# Patient Record
Sex: Male | Born: 1970 | Race: Black or African American | Hispanic: No | Marital: Single | Smoking: Current every day smoker
Health system: Southern US, Community
[De-identification: ages and names within clinical notes are randomized; demographics above are authoritative.]

---

## 2003-07-12 ENCOUNTER — Emergency Department (HOSPITAL_COMMUNITY): Admission: EM | Admit: 2003-07-12 | Discharge: 2003-07-12 | Payer: Self-pay | Admitting: Emergency Medicine

## 2009-06-22 ENCOUNTER — Inpatient Hospital Stay (HOSPITAL_COMMUNITY): Admission: EM | Admit: 2009-06-22 | Discharge: 2009-06-24 | Payer: Self-pay | Admitting: Emergency Medicine

## 2009-06-23 ENCOUNTER — Encounter (INDEPENDENT_AMBULATORY_CARE_PROVIDER_SITE_OTHER): Payer: Self-pay | Admitting: Internal Medicine

## 2009-06-29 ENCOUNTER — Emergency Department (HOSPITAL_COMMUNITY): Admission: EM | Admit: 2009-06-29 | Discharge: 2009-06-29 | Payer: Self-pay | Admitting: Emergency Medicine

## 2009-08-12 ENCOUNTER — Emergency Department (HOSPITAL_COMMUNITY): Admission: EM | Admit: 2009-08-12 | Discharge: 2009-08-13 | Payer: Self-pay | Admitting: Emergency Medicine

## 2010-04-24 LAB — RAPID URINE DRUG SCREEN, HOSP PERFORMED
Amphetamines: NOT DETECTED
Barbiturates: NOT DETECTED
Barbiturates: NOT DETECTED
Benzodiazepines: NOT DETECTED
Benzodiazepines: POSITIVE — AB
Cocaine: NOT DETECTED
Opiates: NOT DETECTED
Tetrahydrocannabinol: POSITIVE — AB

## 2010-04-24 LAB — CARDIAC PANEL(CRET KIN+CKTOT+MB+TROPI)
Total CK: 297 U/L — ABNORMAL HIGH (ref 7–232)
Total CK: 347 U/L — ABNORMAL HIGH (ref 7–232)

## 2010-04-24 LAB — DIFFERENTIAL
Basophils Absolute: 0 10*3/uL (ref 0.0–0.1)
Basophils Relative: 1 % (ref 0–1)
Lymphocytes Relative: 15 % (ref 12–46)
Lymphocytes Relative: 21 % (ref 12–46)
Monocytes Absolute: 0.9 10*3/uL (ref 0.1–1.0)
Monocytes Absolute: 0.9 10*3/uL (ref 0.1–1.0)
Monocytes Relative: 12 % (ref 3–12)
Neutro Abs: 5.2 10*3/uL (ref 1.7–7.7)
Neutrophils Relative %: 67 % (ref 43–77)
Neutrophils Relative %: 72 % (ref 43–77)

## 2010-04-24 LAB — CBC
HCT: 36.5 % — ABNORMAL LOW (ref 39.0–52.0)
Hemoglobin: 13 g/dL (ref 13.0–17.0)
MCHC: 34.9 g/dL (ref 30.0–36.0)
MCHC: 35.6 g/dL (ref 30.0–36.0)
MCV: 95.6 fL (ref 78.0–100.0)
Platelets: 249 10*3/uL (ref 150–400)
RBC: 3.82 MIL/uL — ABNORMAL LOW (ref 4.22–5.81)
WBC: 7.3 10*3/uL (ref 4.0–10.5)

## 2010-04-24 LAB — TSH: TSH: 1.214 u[IU]/mL (ref 0.350–4.500)

## 2010-04-24 LAB — COMPREHENSIVE METABOLIC PANEL
ALT: 35 U/L (ref 0–53)
Alkaline Phosphatase: 64 U/L (ref 39–117)
BUN: 5 mg/dL — ABNORMAL LOW (ref 6–23)
Creatinine, Ser: 0.89 mg/dL (ref 0.4–1.5)
GFR calc Af Amer: >60 mL/min (ref >60)
GFR calc non Af Amer: >60 mL/min (ref >60)
Potassium: 4 mEq/L (ref 3.5–5.1)
Total Protein: 7.6 g/dL (ref 6.0–8.3)

## 2010-04-24 LAB — POCT CARDIAC MARKERS
CKMB, poc: 2.1 ng/mL (ref 1.0–8.0)
CKMB, poc: 3.2 ng/mL (ref 1.0–8.0)
Myoglobin, poc: 115 ng/mL (ref 12–200)
Myoglobin, poc: 91.9 ng/mL (ref 12–200)
Troponin i, poc: <0.05 ng/mL (ref 0.00–0.09)

## 2010-04-24 LAB — LIPID PANEL
HDL: 90 mg/dL (ref >39)
Triglycerides: 70 mg/dL (ref <150)
VLDL: 14 mg/dL (ref 0–40)

## 2010-04-24 LAB — ETHANOL: Alcohol, Ethyl (B): 383 mg/dL — ABNORMAL HIGH (ref 0–10)

## 2010-04-24 LAB — BRAIN NATRIURETIC PEPTIDE: Pro B Natriuretic peptide (BNP): <30 pg/mL (ref 0.0–100.0)

## 2012-03-29 ENCOUNTER — Emergency Department (HOSPITAL_COMMUNITY)
Admission: EM | Admit: 2012-03-29 | Discharge: 2012-03-29 | Disposition: A | Discharge status: Home / Self Care | Payer: MEDICAID | Attending: Emergency Medicine | Admitting: Emergency Medicine

## 2012-03-29 ENCOUNTER — Emergency Department (HOSPITAL_COMMUNITY): Payer: MEDICAID

## 2012-03-29 ENCOUNTER — Encounter (HOSPITAL_COMMUNITY): Payer: Self-pay

## 2012-03-29 DIAGNOSIS — F1010 Alcohol abuse, uncomplicated: Secondary | ICD-10-CM

## 2012-03-29 DIAGNOSIS — F17200 Nicotine dependence, unspecified, uncomplicated: Secondary | ICD-10-CM | POA: Insufficient documentation

## 2012-03-29 DIAGNOSIS — F1210 Cannabis abuse, uncomplicated: Secondary | ICD-10-CM | POA: Insufficient documentation

## 2012-03-29 DIAGNOSIS — S298XXA Other specified injuries of thorax, initial encounter: Secondary | ICD-10-CM | POA: Insufficient documentation

## 2012-03-29 DIAGNOSIS — Y939 Activity, unspecified: Secondary | ICD-10-CM | POA: Insufficient documentation

## 2012-03-29 DIAGNOSIS — W19XXXA Unspecified fall, initial encounter: Secondary | ICD-10-CM | POA: Insufficient documentation

## 2012-03-29 DIAGNOSIS — Y9289 Other specified places as the place of occurrence of the external cause: Secondary | ICD-10-CM | POA: Insufficient documentation

## 2012-03-29 DIAGNOSIS — K920 Hematemesis: Secondary | ICD-10-CM | POA: Insufficient documentation

## 2012-03-29 DIAGNOSIS — R0789 Other chest pain: Secondary | ICD-10-CM

## 2012-03-29 LAB — BASIC METABOLIC PANEL
BUN: 8 mg/dL (ref 6–23)
CO2: 19 mEq/L (ref 19–32)
Calcium: 7.5 mg/dL — ABNORMAL LOW (ref 8.4–10.5)
Chloride: 101 mEq/L (ref 96–112)
Creatinine, Ser: 0.84 mg/dL (ref 0.50–1.35)
Potassium: 3.7 mEq/L (ref 3.5–5.1)

## 2012-03-29 LAB — CBC
MCH: 32.5 pg (ref 26.0–34.0)
MCHC: 34.3 g/dL (ref 30.0–36.0)
MCV: 94.8 fL (ref 78.0–100.0)
Platelets: 205 10*3/uL (ref 150–400)
RDW: 13.9 % (ref 11.5–15.5)
WBC: 6.5 10*3/uL (ref 4.0–10.5)

## 2012-03-29 MED ORDER — SODIUM CHLORIDE 0.9 % IV BOLUS (SEPSIS)
1000.0000 mL | Freq: Once | INTRAVENOUS | Status: AC
  Administered 2012-03-29: 1000 mL via INTRAVENOUS

## 2012-03-29 MED ORDER — PANTOPRAZOLE SODIUM 40 MG IV SOLR
40.0000 mg | Freq: Once | INTRAVENOUS | Status: AC
  Administered 2012-03-29: 40 mg via INTRAVENOUS
  Filled 2012-03-29: qty 40

## 2012-03-29 NOTE — ED Notes (Signed)
CRITICAL VALUE ALERT  Critical value received:  ETOH 451  Date of notification:  03/29/12  Time of notification:  2242  Critical value read back:yes  Nurse who received alert:  Eustace Quail RN  MD notified (1st page):  Dr. Estell Harpin  Time of first page:    MD notified (2nd page):  Time of second page:  Responding MD:    Time MD responded:

## 2012-03-29 NOTE — ED Notes (Signed)
Patient has been drinking beer, wine, and liquor. Possibly has been vomiting blood

## 2012-03-29 NOTE — ED Notes (Signed)
Pt discharged. Pt stable at time of discharge. Medications reviewed pt has no questions regarding discharge at this time. Pt voiced understanding of discharge instructions.  

## 2012-03-29 NOTE — ED Provider Notes (Signed)
History    This chart was scribed for [name], MD by Melba Coon, ED Scribe. The patient was seen in room APA05/APA05 and the patient's care was started at 9:49PM.    CSN: [id number]  Arrival date & time 03/29/12  2144   First MD Initiated Contact with Patient 03/29/12 2147      Chief Complaint  Patient presents with  . Hematemesis  . Alcohol Intoxication    (Consider location/radiation/quality/duration/timing/severity/associated sxs/prior treatment) Patient is a 42 y.o. male presenting with intoxication. The history is provided by the patient. The history is limited by the condition of the patient (alcohol intoxication). No language interpreter was used.  Alcohol Intoxication This is a new problem. The current episode started less than 1 hour ago. The problem occurs constantly. The problem has not changed since onset.Nothing aggravates the symptoms. Nothing relieves the symptoms. He has tried nothing for the symptoms.   A Level 5 Caveat applies due to the condition of the patient (altered mental status due to alcohol intoxication). Carl Evans is a 42 y.o. male who EMS presents to the Emergency Department for alcohol intoxication since this evening. Carl Evans is  Intoxicated upon arrival and can not answer all questions. He reports that he fell with no head contact or LOC and he has left chest/rib pain. EMS reports that he was picked up at a party where everyone was intoxicated which made obtaining a reliable story difficult. He was apparently causing some trouble at the party and everyone wanted him to leave. Someone at the party reported that he had hematemesis. He was given IV fluids en route to the ED. No known allergies. No other pertinent medical symptoms.  History reviewed. No pertinent past medical history.  History reviewed. No pertinent past surgical history.  History reviewed. No pertinent family history.  History  Substance Use Topics  . Smoking status:  Current Every Day Smoker  . Smokeless tobacco: Not on file  . Alcohol Use: Yes      Review of Systems  Unable to perform ROS   Allergies  Review of patient's allergies indicates no known allergies.  Home Medications  No current outpatient prescriptions on file.  BP 150/104  Pulse 89  Temp(Src) 97.9 F (36.6 C)  Resp 20  Ht 5\' 8"  (1.727 m)  Wt 140 lb (63.504 kg)  BMI 21.29 kg/m2  SpO2 93%  Physical Exam  Nursing note and vitals reviewed. Constitutional: He is oriented to person, place, and time. He appears well-developed.  Smells of alcohol.  HENT:  Head: Normocephalic and atraumatic.  Eyes: Conjunctivae and EOM are normal. No scleral icterus.  Neck: Neck supple. No thyromegaly present.  Cardiovascular: Normal rate and regular rhythm.  Exam reveals no gallop and no friction rub.   No murmur heard. Pulmonary/Chest: No stridor. He has no wheezes. He has no rales. He exhibits tenderness.  Tenderness to the left anterior chest.  Abdominal: He exhibits no distension. There is no tenderness. There is no rebound.  Musculoskeletal: Normal range of motion. He exhibits no edema.  Lymphadenopathy:    He has no cervical adenopathy.  Neurological: He is oriented to person, place, and time. Coordination normal.  Skin: No rash noted. No erythema.  Psychiatric: He has a normal mood and affect. His behavior is normal.    ED Course  Procedures (including critical care time)  COORDINATION OF CARE:  9:53PM - protonix, IV fluids, CXR, head CT without contrast, CBC, BMP, and ETOH will be ordered  for [employer].     Labs Reviewed - No data to display No results found.   No diagnosis found.    MDM   The chart was scribed for me under my direct supervision.  I personally performed the history, physical, and medical decision making and all procedures in the evaluation of this patient.[name], MD 03/29/12 [phone]

## 2014-01-25 ENCOUNTER — Emergency Department (HOSPITAL_COMMUNITY): Payer: Medicaid Other

## 2014-01-25 ENCOUNTER — Emergency Department (HOSPITAL_COMMUNITY)
Admission: EM | Admit: 2014-01-25 | Discharge: 2014-01-25 | Disposition: A | Discharge status: Home / Self Care | Payer: Medicaid Other | Attending: Emergency Medicine | Admitting: Emergency Medicine

## 2014-01-25 ENCOUNTER — Encounter (HOSPITAL_COMMUNITY): Payer: Self-pay | Admitting: *Deleted

## 2014-01-25 DIAGNOSIS — R079 Chest pain, unspecified: Secondary | ICD-10-CM | POA: Diagnosis present

## 2014-01-25 DIAGNOSIS — F10120 Alcohol abuse with intoxication, uncomplicated: Secondary | ICD-10-CM | POA: Insufficient documentation

## 2014-01-25 DIAGNOSIS — Z720 Tobacco use: Secondary | ICD-10-CM | POA: Insufficient documentation

## 2014-01-25 DIAGNOSIS — R0789 Other chest pain: Secondary | ICD-10-CM | POA: Diagnosis not present

## 2014-01-25 DIAGNOSIS — F10920 Alcohol use, unspecified with intoxication, uncomplicated: Secondary | ICD-10-CM

## 2014-01-25 LAB — COMPREHENSIVE METABOLIC PANEL
ALBUMIN: 3.4 g/dL — AB (ref 3.5–5.2)
ALT: 36 U/L (ref 0–53)
AST: 67 U/L — AB (ref 0–37)
Alkaline Phosphatase: 77 U/L (ref 39–117)
Anion gap: 16 — ABNORMAL HIGH (ref 5–15)
BUN: 10 mg/dL (ref 6–23)
CALCIUM: 9 mg/dL (ref 8.4–10.5)
CO2: 24 mEq/L (ref 19–32)
CREATININE: 1.05 mg/dL (ref 0.50–1.35)
Chloride: 105 mEq/L (ref 96–112)
GFR calc Af Amer: >90 mL/min (ref >90)
GFR calc non Af Amer: 85 mL/min — ABNORMAL LOW (ref >90)
Glucose, Bld: 91 mg/dL (ref 70–99)
Potassium: 4.4 mEq/L (ref 3.7–5.3)
SODIUM: 145 meq/L (ref 137–147)
TOTAL PROTEIN: 7.5 g/dL (ref 6.0–8.3)
Total Bilirubin: <0.2 mg/dL — ABNORMAL LOW (ref 0.3–1.2)

## 2014-01-25 LAB — TROPONIN I

## 2014-01-25 LAB — CBC WITH DIFFERENTIAL/PLATELET
BASOS ABS: 0 10*3/uL (ref 0.0–0.1)
Basophils Relative: 0 % (ref 0–1)
EOS PCT: 1 % (ref 0–5)
Eosinophils Absolute: 0.1 10*3/uL (ref 0.0–0.7)
HEMATOCRIT: 40.4 % (ref 39.0–52.0)
Hemoglobin: 13.6 g/dL (ref 13.0–17.0)
LYMPHS ABS: 1.5 10*3/uL (ref 0.7–4.0)
LYMPHS PCT: 21 % (ref 12–46)
MCH: 32.5 pg (ref 26.0–34.0)
MCHC: 33.7 g/dL (ref 30.0–36.0)
MCV: 96.4 fL (ref 78.0–100.0)
MONO ABS: 0.7 10*3/uL (ref 0.1–1.0)
Monocytes Relative: 10 % (ref 3–12)
NEUTROS ABS: 4.7 10*3/uL (ref 1.7–7.7)
Neutrophils Relative %: 68 % (ref 43–77)
Platelets: 322 10*3/uL (ref 150–400)
RBC: 4.19 MIL/uL — AB (ref 4.22–5.81)
RDW: 14.8 % (ref 11.5–15.5)
WBC: 7 10*3/uL (ref 4.0–10.5)

## 2014-01-25 MED ORDER — KETOROLAC TROMETHAMINE 30 MG/ML IJ SOLN
30.0000 mg | Freq: Once | INTRAMUSCULAR | Status: AC
  Administered 2014-01-25: 30 mg via INTRAVENOUS
  Filled 2014-01-25: qty 1

## 2014-01-25 MED ORDER — SODIUM CHLORIDE 0.9 % IV SOLN
1000.0000 mL | Freq: Once | INTRAVENOUS | Status: AC
  Administered 2014-01-25: 1000 mL via INTRAVENOUS

## 2014-01-25 MED ORDER — NAPROXEN 500 MG PO TABS: 500.0000 mg | ORAL_TABLET | Freq: Two times a day (BID) | ORAL | Status: DC

## 2014-01-25 MED ORDER — SODIUM CHLORIDE 0.9 % IV SOLN: 1000.0000 mL | INTRAVENOUS | Status: DC

## 2014-01-25 NOTE — ED Provider Notes (Signed)
CSN: [id number]     Arrival date & time 01/25/14  0109 History   First MD Initiated Contact with Patient 01/25/14 0108     Chief Complaint  Patient presents with  . Chest Pain     (Consider location/radiation/quality/duration/timing/severity/associated sxs/prior Treatment) Patient is a 43 y.o. male presenting with chest pain. The history is provided by the patient. The history is limited by the condition of the patient (Intoxicated).  Chest Pain He complains of left-sided chest pain for the last 2 weeks. Pain is intermittent but is unable to explain to me what things going on and what things make it better and what things make it worse. Pain is sharp and he rates it at 9/10. He says that when it comes on its days but will not tell me for how long. He has not taken anything for pain, but he has been drinking heavily. He states he drank 80 ounces of beer today and he does had on most days. He also uses cocaine and last used cocaine 2 days ago. He denies cough, dyspnea, nausea, diaphoresis.  History reviewed. No pertinent past medical history. History reviewed. No pertinent past surgical history. History reviewed. No pertinent family history. History  Substance Use Topics  . Smoking status: Current Every Day Smoker  . Smokeless tobacco: Not on file  . Alcohol Use: Yes    Review of Systems  Unable to perform ROS: Other  Cardiovascular: Positive for chest pain.      Allergies  Review of patient's allergies indicates no known allergies.  Home Medications   Prior to Admission medications   Not on File   BP 123/85 mmHg  Pulse 111  Temp(Src) 97.9 F (36.6 C) (Oral)  Resp 24  Ht 5\' 9"  (1.753 m)  Wt 145 lb (65.772 kg)  BMI 21.40 kg/m2  SpO2 94% Physical Exam  Nursing note and vitals reviewed.  43 year old male, resting comfortably and in no acute distress. Vital signs are significant for tachycardia and tachypnea. Oxygen saturation is 94%, which is normal. He is clinically  intoxicated with a strong odor of ethanol on his breath. Head is normocephalic and atraumatic. PERRLA, EOMI. Oropharynx is clear. Neck is nontender and supple without adenopathy or JVD. Back is nontender and there is no CVA tenderness. Lungs are clear without rales, wheezes, or rhonchi. Chest is tender in the left anterior chest wall. Heart has regular rate and rhythm without murmur. Abdomen is soft, flat, nontender without masses or hepatosplenomegaly and peristalsis is normoactive. Extremities have no cyanosis or edema, full range of motion is present. Skin is warm and dry without rash. Neurologic: He is clinically intoxicated with slurring of speech and strong odor of ethanol on his breath, cranial nerves are intact, there are no motor or sensory deficits.  ED Course  Procedures (including critical care time) Labs Review Results for orders placed or performed during the hospital encounter of 01/25/14  Comprehensive metabolic panel  Result Value Ref Range   Sodium 145 137 - 147 mEq/L   Potassium 4.4 3.7 - 5.3 mEq/L   Chloride 105 96 - 112 mEq/L   CO2 24 19 - 32 mEq/L   Glucose, Bld 91 70 - 99 mg/dL   BUN 10 6 - 23 mg/dL   Creatinine, Ser 8.651.05 0.50 - 1.35 mg/dL   Calcium 9.0 8.4 - 78.410.5 mg/dL   Total Protein 7.5 6.0 - 8.3 g/dL   Albumin 3.4 (L) 3.5 - 5.2 g/dL   AST 67 (H) 0 -  37 U/L   ALT 36 0 - 53 U/L   Alkaline Phosphatase 77 39 - 117 U/L   Total Bilirubin <0.2 (L) 0.3 - 1.2 mg/dL   GFR calc non Af Amer 85 (L) >90 mL/min   GFR calc Af Amer >90 >90 mL/min   Anion gap 16 (H) 5 - 15  Troponin I  Result Value Ref Range   Troponin I <0.30 <0.30 ng/mL  CBC with Differential  Result Value Ref Range   WBC 7.0 4.0 - 10.5 K/uL   RBC 4.19 (L) 4.22 - 5.81 MIL/uL   Hemoglobin 13.6 13.0 - 17.0 g/dL   HCT 16.140.4 09.639.0 - 04.552.0 %   MCV 96.4 78.0 - 100.0 fL   MCH 32.5 26.0 - 34.0 pg   MCHC 33.7 30.0 - 36.0 g/dL   RDW 40.914.8 81.111.5 - 91.415.5 %   Platelets 322 150 - 400 K/uL   Neutrophils  Relative % 68 43 - 77 %   Neutro Abs 4.7 1.7 - 7.7 K/uL   Lymphocytes Relative 21 12 - 46 %   Lymphs Abs 1.5 0.7 - 4.0 K/uL   Monocytes Relative 10 3 - 12 %   Monocytes Absolute 0.7 0.1 - 1.0 K/uL   Eosinophils Relative 1 0 - 5 %   Eosinophils Absolute 0.1 0.0 - 0.7 K/uL   Basophils Relative 0 0 - 1 %   Basophils Absolute 0.0 0.0 - 0.1 K/uL   Imaging Review Dg Chest 2 View  01/25/2014   CLINICAL DATA:  Acute onset of left-sided chest pain. Cough for 2 weeks. Initial encounter.  EXAM: CHEST  2 VIEW  COMPARISON:  Chest radiograph performed 03/29/2012  FINDINGS: The lungs are well-aerated and clear. There is no evidence of focal opacification, pleural effusion or pneumothorax.  The heart is normal in size; the mediastinal contour is within normal limits. No acute osseous abnormalities are seen.  IMPRESSION: No acute cardiopulmonary process seen.   Electronically Signed   By: Roanna RaiderJeffery  Chang M.D.   On: 01/25/2014 03:14     EKG Interpretation   Date/Time:  Monday January 25 2014 02:50:26 EST Ventricular Rate:  99 PR Interval:  136 QRS Duration: 87 QT Interval:  343 QTC Calculation: 440 R Axis:   73 Text Interpretation:  Sinus rhythm Normal ECG When compared with ECG of  06/29/2009, No significant change was found Confirmed by [facility]  MD, Kalen Ratajczak  ([id number]) on 01/25/2014 2:52:52 AM      MDM   Final diagnoses:  Alcohol intoxication, uncomplicated  Chest wall pain    Chest pain which seems to be chest wall pain. Ethanol intoxication. Old records are reviewed and he does have a prior ED visit for chest wall pain in the setting of ethanol intoxication. Workup will be initiated including laboratory testing, ECG, chest x-ray and he will be given a therapeutic trial of ketorolac.  Chest x-ray and ECG are normal. Laboratory workup is significant for mild elevation of AST which is likely related to alcohol abuse. Following ketorolac, he was sleeping. When awakened he stated that pain was still  there but that would fall right back to sleep. He is observed in the ED overnight. He is discharged with prescription for naproxen and advised to stop drinking.    Dione Boozeavid Shishir Krantz, MD 01/25/14 [id number]

## 2014-01-25 NOTE — ED Notes (Signed)
Pt came in intoxicated. EDP wants pt to stay here until he is sober enough to go home.

## 2014-01-25 NOTE — ED Notes (Signed)
Pt c/o left sided chest pain x 2 weeks. Pt has had 2-40's on board

## 2014-01-25 NOTE — Discharge Instructions (Signed)
Alcohol Intoxication Alcohol intoxication occurs when the amount of alcohol that a person has consumed impairs his or her ability to mentally and physically function. Alcohol directly impairs the normal chemical activity of the brain. Drinking large amounts of alcohol can lead to changes in mental function and behavior, and it can cause many physical effects that can be harmful.  Alcohol intoxication can range in severity from mild to very severe. Various factors can affect the level of intoxication that occurs, such as the person's age, gender, weight, frequency of alcohol consumption, and the presence of other medical conditions (such as diabetes, seizures, or heart conditions). Dangerous levels of alcohol intoxication may occur when people drink large amounts of alcohol in a short period (binge drinking). Alcohol can also be especially dangerous when combined with certain prescription medicines or "recreational" drugs. SIGNS AND SYMPTOMS Some common signs and symptoms of mild alcohol intoxication include:  Loss of coordination.  Changes in mood and behavior.  Impaired judgment.  Slurred speech. As alcohol intoxication progresses to more severe levels, other signs and symptoms will appear. These may include:  Vomiting.  Confusion and impaired memory.  Slowed breathing.  Seizures.  Loss of consciousness. DIAGNOSIS  Your health care provider will take a medical history and perform a physical exam. You will be asked about the amount and type of alcohol you have consumed. Blood tests will be done to measure the concentration of alcohol in your blood. In many places, your blood alcohol level must be lower than 80 mg/dL (0.08%) to legally drive. However, many dangerous effects of alcohol can occur at much lower levels.  TREATMENT  People with alcohol intoxication often do not require treatment. Most of the effects of alcohol intoxication are temporary, and they go away as the alcohol naturally  leaves the body. Your health care provider will monitor your condition until you are stable enough to go home. Fluids are sometimes given through an IV access tube to help prevent dehydration.  HOME CARE INSTRUCTIONS  Do not drive after drinking alcohol.  Stay hydrated. Drink enough water and fluids to keep your urine clear or pale yellow. Avoid caffeine.   Only take over-the-counter or prescription medicines as directed by your health care provider.  SEEK MEDICAL CARE IF:   You have persistent vomiting.   You do not feel better after a few days.  You have frequent alcohol intoxication. Your health care provider can help determine if you should see a substance use treatment counselor. SEEK IMMEDIATE MEDICAL CARE IF:   You become shaky or tremble when you try to stop drinking.   You shake uncontrollably (seizure).   You throw up (vomit) blood. This may be bright red or may look like black coffee grounds.   You have blood in your stool. This may be bright red or may appear as a black, tarry, bad smelling stool.   You become lightheaded or faint.  MAKE SURE YOU:   Understand these instructions.  Will watch your condition.  Will get help right away if you are not doing well or get worse. Document Released: 11/01/2004 Document Revised: 09/24/2012 Document Reviewed: 06/27/2012 [facility] Patient Information 2015 [place], [place]. This information is not intended to replace advice given to you by your health care provider. Make sure you discuss any questions you have with your health care provider.  Chest Wall Pain Chest wall pain is pain in or around the bones and muscles of your chest. It may take up to 6 weeks to  get better. It may take longer if you must stay physically active in your work and activities.  CAUSES  Chest wall pain may happen on its own. However, it may be caused by:  A viral illness like the  flu.  Injury.  Coughing.  Exercise.  Arthritis.  Fibromyalgia.  Shingles. HOME CARE INSTRUCTIONS   Avoid overtiring physical activity. Try not to strain or perform activities that cause pain. This includes any activities using your chest or your abdominal and side muscles, especially if heavy weights are used.  Put ice on the sore area.  Put ice in a plastic bag.  Place a towel between your skin and the bag.  Leave the ice on for 15-20 minutes per hour while awake for the first 2 days.  Only take over-the-counter or prescription medicines for pain, discomfort, or fever as directed by your caregiver. SEEK IMMEDIATE MEDICAL CARE IF:   Your pain increases, or you are very uncomfortable.  You have a fever.  Your chest pain becomes worse.  You have new, unexplained symptoms.  You have nausea or vomiting.  You feel sweaty or lightheaded.  You have a cough with phlegm (sputum), or you cough up blood. MAKE SURE YOU:   Understand these instructions.  Will watch your condition.  Will get help right away if you are not doing well or get worse. Document Released: 01/22/2005 Document Revised: 04/16/2011 Document Reviewed: 09/18/2010 [facility] Patient Information 2015 [employer] [place]. This information is not intended to replace advice given to you by your health care provider. Make sure you discuss any questions you have with your health care provider.  Naproxen and naproxen sodium oral immediate-release tablets What is this medicine? NAPROXEN (na PROX en) is a non-steroidal anti-inflammatory drug (NSAID). It is used to reduce swelling and to treat pain. This medicine may be used for dental pain, headache, or painful monthly periods. It is also used for painful joint and muscular problems such as arthritis, tendinitis, bursitis, and gout. This medicine may be used for other purposes; ask your health care provider or pharmacist if you have questions. COMMON BRAND NAME(S):  Aflaxen, Aleve, Aleve Arthritis, All Day Relief, Anaprox, Anaprox DS, Naprosyn What should I tell my health care provider before I take this medicine? They need to know if you have any of these conditions: -asthma -cigarette smoker -drink more than 3 alcohol containing drinks a day -heart disease or circulation problems such as heart failure or leg edema (fluid retention) -high blood pressure -kidney disease -liver disease -stomach bleeding or ulcers -an unusual or allergic reaction to naproxen, aspirin, other NSAIDs, other medicines, foods, dyes, or preservatives -pregnant or trying to get pregnant -breast-feeding How should I use this medicine? Take this medicine by mouth with a glass of water. Follow the directions on the prescription label. Take it with food if your stomach gets upset. Try to not lie down for at least 10 minutes after you take it. Take your medicine at regular intervals. Do not take your medicine more often than directed. Long-term, continuous use may increase the risk of heart attack or stroke. A special MedGuide will be given to you by the pharmacist with each prescription and refill. Be sure to read this information carefully each time. Talk to your pediatrician regarding the use of this medicine in children. Special care may be needed. Overdosage: If you think you have taken too much of this medicine contact a poison control center or emergency room at once. NOTE: This medicine is  only for you. Do not share this medicine with others. What if I miss a dose? If you miss a dose, take it as soon as you can. If it is almost time for your next dose, take only that dose. Do not take double or extra doses. What may interact with this medicine? -alcohol -aspirin -cidofovir -diuretics -lithium -methotrexate -other drugs for inflammation like ketorolac or prednisone -pemetrexed -probenecid -warfarin This list may not describe all possible interactions. Give your health  care provider a list of all the medicines, herbs, non-prescription drugs, or dietary supplements you use. Also tell them if you smoke, drink alcohol, or use illegal drugs. Some items may interact with your medicine. What should I watch for while using this medicine? Tell your doctor or health care professional if your pain does not get better. Talk to your doctor before taking another medicine for pain. Do not treat yourself. This medicine does not prevent heart attack or stroke. In fact, this medicine may increase the chance of a heart attack or stroke. The chance may increase with longer use of this medicine and in people who have heart disease. If you take aspirin to prevent heart attack or stroke, talk with your doctor or health care professional. Do not take other medicines that contain aspirin, ibuprofen, or naproxen with this medicine. Side effects such as stomach upset, nausea, or ulcers may be more likely to occur. Many medicines available without a prescription should not be taken with this medicine. This medicine can cause ulcers and bleeding in the stomach and intestines at any time during treatment. Do not smoke cigarettes or drink alcohol. These increase irritation to your stomach and can make it more susceptible to damage from this medicine. Ulcers and bleeding can happen without warning symptoms and can cause death. You may get drowsy or dizzy. Do not drive, use machinery, or do anything that needs mental alertness until you know how this medicine affects you. Do not stand or sit up quickly, especially if you are an older patient. This reduces the risk of dizzy or fainting spells. This medicine can cause you to bleed more easily. Try to avoid damage to your teeth and gums when you brush or floss your teeth. What side effects may I notice from receiving this medicine? Side effects that you should report to your doctor or health care professional as soon as possible: -black or bloody stools,  blood in the urine or vomit -blurred vision -chest pain -difficulty breathing or wheezing -nausea or vomiting -severe stomach pain -skin rash, skin redness, blistering or peeling skin, hives, or itching -slurred speech or weakness on one side of the body -swelling of eyelids, throat, lips -unexplained weight gain or swelling -unusually weak or tired -yellowing of eyes or skin Side effects that usually do not require medical attention (report to your doctor or health care professional if they continue or are bothersome): -constipation -headache -heartburn This list may not describe all possible side effects. Call your doctor for medical advice about side effects. You may report side effects to FDA at 1-800-FDA-1088. Where should I keep my medicine? Keep out of the reach of children. Store at room temperature between 15 and 30 degrees C (59 and 86 degrees F). Keep container tightly closed. Throw away any unused medicine after the expiration date. NOTE: This sheet is a summary. It may not cover all possible information. If you have questions about this medicine, talk to your doctor, pharmacist, or health care provider.  2015, [employer]. (2009-01-24 20:10:16)

## 2014-01-25 NOTE — ED Notes (Signed)
Resting with eyes shut.  No distress.  

## 2014-01-25 NOTE — ED Notes (Signed)
Pt sleeping. 

## 2018-06-20 ENCOUNTER — Other Ambulatory Visit: Payer: Self-pay

## 2018-06-20 ENCOUNTER — Inpatient Hospital Stay (HOSPITAL_COMMUNITY)
Admission: EM | Admit: 2018-06-20 | Discharge: 2018-06-23 | DRG: 871 | Disposition: A | Discharge status: Home / Self Care | Payer: Medicaid Other | Attending: Internal Medicine | Admitting: Internal Medicine

## 2018-06-20 ENCOUNTER — Emergency Department (HOSPITAL_COMMUNITY): Payer: Medicaid Other

## 2018-06-20 ENCOUNTER — Encounter (HOSPITAL_COMMUNITY): Payer: Self-pay

## 2018-06-20 DIAGNOSIS — Z8349 Family history of other endocrine, nutritional and metabolic diseases: Secondary | ICD-10-CM

## 2018-06-20 DIAGNOSIS — J181 Lobar pneumonia, unspecified organism: Secondary | ICD-10-CM | POA: Diagnosis not present

## 2018-06-20 DIAGNOSIS — D649 Anemia, unspecified: Secondary | ICD-10-CM | POA: Diagnosis present

## 2018-06-20 DIAGNOSIS — Z20828 Contact with and (suspected) exposure to other viral communicable diseases: Secondary | ICD-10-CM | POA: Diagnosis present

## 2018-06-20 DIAGNOSIS — R079 Chest pain, unspecified: Secondary | ICD-10-CM | POA: Diagnosis not present

## 2018-06-20 DIAGNOSIS — Z720 Tobacco use: Secondary | ICD-10-CM | POA: Diagnosis not present

## 2018-06-20 DIAGNOSIS — A419 Sepsis, unspecified organism: Secondary | ICD-10-CM | POA: Diagnosis not present

## 2018-06-20 DIAGNOSIS — Z841 Family history of disorders of kidney and ureter: Secondary | ICD-10-CM

## 2018-06-20 DIAGNOSIS — F17200 Nicotine dependence, unspecified, uncomplicated: Secondary | ICD-10-CM | POA: Diagnosis present

## 2018-06-20 DIAGNOSIS — Z681 Body mass index (BMI) 19 or less, adult: Secondary | ICD-10-CM | POA: Diagnosis not present

## 2018-06-20 DIAGNOSIS — F1010 Alcohol abuse, uncomplicated: Secondary | ICD-10-CM | POA: Diagnosis not present

## 2018-06-20 DIAGNOSIS — E43 Unspecified severe protein-calorie malnutrition: Secondary | ICD-10-CM | POA: Diagnosis not present

## 2018-06-20 DIAGNOSIS — J189 Pneumonia, unspecified organism: Secondary | ICD-10-CM | POA: Diagnosis present

## 2018-06-20 DIAGNOSIS — E876 Hypokalemia: Secondary | ICD-10-CM | POA: Diagnosis present

## 2018-06-20 DIAGNOSIS — E871 Hypo-osmolality and hyponatremia: Secondary | ICD-10-CM | POA: Diagnosis present

## 2018-06-20 DIAGNOSIS — R0781 Pleurodynia: Secondary | ICD-10-CM | POA: Diagnosis not present

## 2018-06-20 HISTORY — DX: Alcohol abuse, uncomplicated: F10.10

## 2018-06-20 HISTORY — DX: Tobacco use: Z72.0

## 2018-06-20 LAB — D-DIMER, QUANTITATIVE (NOT AT ARMC): D-Dimer, Quant: 1.63 ug/mL-FEU — ABNORMAL HIGH (ref 0.00–0.50)

## 2018-06-20 LAB — BASIC METABOLIC PANEL
Anion gap: 12 (ref 5–15)
BUN: 14 mg/dL (ref 6–20)
CO2: 25 mmol/L (ref 22–32)
Calcium: 8.4 mg/dL — ABNORMAL LOW (ref 8.9–10.3)
Chloride: 91 mmol/L — ABNORMAL LOW (ref 98–111)
Creatinine, Ser: 0.83 mg/dL (ref 0.61–1.24)
GFR calc Af Amer: >60 mL/min (ref >60)
GFR calc non Af Amer: >60 mL/min (ref >60)
Glucose, Bld: 103 mg/dL — ABNORMAL HIGH (ref 70–99)
Potassium: 3.7 mmol/L (ref 3.5–5.1)
Sodium: 128 mmol/L — ABNORMAL LOW (ref 135–145)

## 2018-06-20 LAB — COMPREHENSIVE METABOLIC PANEL
ALT: 14 U/L (ref 0–44)
AST: 14 U/L — ABNORMAL LOW (ref 15–41)
Albumin: 2.5 g/dL — ABNORMAL LOW (ref 3.5–5.0)
Alkaline Phosphatase: 76 U/L (ref 38–126)
Anion gap: 17 — ABNORMAL HIGH (ref 5–15)
BUN: 18 mg/dL (ref 6–20)
CO2: 24 mmol/L (ref 22–32)
Calcium: 8.5 mg/dL — ABNORMAL LOW (ref 8.9–10.3)
Chloride: 83 mmol/L — ABNORMAL LOW (ref 98–111)
Creatinine, Ser: 1.07 mg/dL (ref 0.61–1.24)
GFR calc Af Amer: >60 mL/min (ref >60)
GFR calc non Af Amer: >60 mL/min (ref >60)
Glucose, Bld: 130 mg/dL — ABNORMAL HIGH (ref 70–99)
Potassium: 3.2 mmol/L — ABNORMAL LOW (ref 3.5–5.1)
Sodium: 124 mmol/L — ABNORMAL LOW (ref 135–145)
Total Bilirubin: 0.9 mg/dL (ref 0.3–1.2)
Total Protein: 7.4 g/dL (ref 6.5–8.1)

## 2018-06-20 LAB — CBC
HCT: 32.8 % — ABNORMAL LOW (ref 39.0–52.0)
Hemoglobin: 11.7 g/dL — ABNORMAL LOW (ref 13.0–17.0)
MCH: 31.1 pg (ref 26.0–34.0)
MCHC: 35.7 g/dL (ref 30.0–36.0)
MCV: 87.2 fL (ref 80.0–100.0)
Platelets: 512 10*3/uL — ABNORMAL HIGH (ref 150–400)
RBC: 3.76 MIL/uL — ABNORMAL LOW (ref 4.22–5.81)
RDW: 12.7 % (ref 11.5–15.5)
WBC: 27.1 10*3/uL — ABNORMAL HIGH (ref 4.0–10.5)
nRBC: 0 % (ref 0.0–0.2)

## 2018-06-20 LAB — PHOSPHORUS: Phosphorus: 3 mg/dL (ref 2.5–4.6)

## 2018-06-20 LAB — MAGNESIUM: Magnesium: 1.7 mg/dL (ref 1.7–2.4)

## 2018-06-20 LAB — CBG MONITORING, ED: Glucose-Capillary: 117 mg/dL — ABNORMAL HIGH (ref 70–99)

## 2018-06-20 LAB — SARS CORONAVIRUS 2 BY RT PCR (HOSPITAL ORDER, PERFORMED IN ~~LOC~~ HOSPITAL LAB): SARS Coronavirus 2: NEGATIVE

## 2018-06-20 LAB — TROPONIN I: Troponin I: <0.03 ng/mL (ref <0.03)

## 2018-06-20 MED ORDER — SODIUM CHLORIDE 0.9 % IV SOLN
500.0000 mg | Freq: Once | INTRAVENOUS | Status: AC
  Administered 2018-06-20: 500 mg via INTRAVENOUS
  Filled 2018-06-20: qty 500

## 2018-06-20 MED ORDER — SODIUM CHLORIDE 0.9 % IV SOLN
INTRAVENOUS | Status: DC | PRN
  Administered 2018-06-20: 250 mL via INTRAVENOUS

## 2018-06-20 MED ORDER — FOLIC ACID 1 MG PO TABS
1.0000 mg | ORAL_TABLET | Freq: Every day | ORAL | Status: DC
  Administered 2018-06-20 – 2018-06-23 (×4): 1 mg via ORAL
  Filled 2018-06-20 (×4): qty 1

## 2018-06-20 MED ORDER — MAGNESIUM SULFATE 2 GM/50ML IV SOLN
2.0000 g | Freq: Once | INTRAVENOUS | Status: AC
  Administered 2018-06-20: 2 g via INTRAVENOUS
  Filled 2018-06-20: qty 50

## 2018-06-20 MED ORDER — SODIUM CHLORIDE 0.9 % IV SOLN: 1.0000 g | INTRAVENOUS | Status: DC

## 2018-06-20 MED ORDER — PIPERACILLIN-TAZOBACTAM 3.375 G IVPB
3.3750 g | Freq: Three times a day (TID) | INTRAVENOUS | Status: DC
  Administered 2018-06-20 – 2018-06-23 (×10): 3.375 g via INTRAVENOUS
  Filled 2018-06-20 (×10): qty 50

## 2018-06-20 MED ORDER — ADULT MULTIVITAMIN W/MINERALS CH
1.0000 | ORAL_TABLET | Freq: Every day | ORAL | Status: DC
  Administered 2018-06-20 – 2018-06-23 (×4): 1 via ORAL
  Filled 2018-06-20 (×4): qty 1

## 2018-06-20 MED ORDER — IOHEXOL 350 MG/ML SOLN
100.0000 mL | Freq: Once | INTRAVENOUS | Status: AC | PRN
  Administered 2018-06-20: 100 mL via INTRAVENOUS

## 2018-06-20 MED ORDER — POTASSIUM CHLORIDE IN NACL 40-0.9 MEQ/L-% IV SOLN
INTRAVENOUS | Status: DC
  Administered 2018-06-20 – 2018-06-22 (×5): 125 mL/h via INTRAVENOUS
  Filled 2018-06-20 (×2): qty 1000

## 2018-06-20 MED ORDER — LACTATED RINGERS IV BOLUS
1000.0000 mL | Freq: Once | INTRAVENOUS | Status: AC
Start: 2018-06-20 — End: 2018-06-20
  Administered 2018-06-20: 1000 mL via INTRAVENOUS

## 2018-06-20 MED ORDER — LORAZEPAM 1 MG PO TABS: 1.0000 mg | ORAL_TABLET | Freq: Four times a day (QID) | ORAL | Status: AC | PRN

## 2018-06-20 MED ORDER — SODIUM CHLORIDE 0.9 % IV SOLN
500.0000 mg | INTRAVENOUS | Status: DC
  Administered 2018-06-21 – 2018-06-23 (×3): 500 mg via INTRAVENOUS
  Filled 2018-06-20 (×3): qty 500

## 2018-06-20 MED ORDER — SODIUM CHLORIDE 0.9 % IV SOLN
1.0000 g | Freq: Once | INTRAVENOUS | Status: AC
  Administered 2018-06-20: 1 g via INTRAVENOUS
  Filled 2018-06-20: qty 10

## 2018-06-20 MED ORDER — LORAZEPAM 2 MG/ML IJ SOLN: 0.0000 mg | Freq: Four times a day (QID) | INTRAMUSCULAR | Status: AC

## 2018-06-20 MED ORDER — THIAMINE HCL 100 MG/ML IJ SOLN
100.0000 mg | Freq: Every day | INTRAMUSCULAR | Status: DC
  Administered 2018-06-21: 100 mg via INTRAVENOUS
  Filled 2018-06-20 (×2): qty 2

## 2018-06-20 MED ORDER — LORAZEPAM 2 MG/ML IJ SOLN: 0.0000 mg | Freq: Two times a day (BID) | INTRAMUSCULAR | Status: DC

## 2018-06-20 MED ORDER — VITAMIN B-1 100 MG PO TABS
100.0000 mg | ORAL_TABLET | Freq: Every day | ORAL | Status: DC
  Administered 2018-06-20 – 2018-06-23 (×4): 100 mg via ORAL
  Filled 2018-06-20 (×4): qty 1

## 2018-06-20 MED ORDER — NICOTINE 21 MG/24HR TD PT24
21.0000 mg | MEDICATED_PATCH | Freq: Every day | TRANSDERMAL | Status: DC
  Administered 2018-06-20 – 2018-06-23 (×4): 21 mg via TRANSDERMAL
  Filled 2018-06-20 (×4): qty 1

## 2018-06-20 MED ORDER — LORAZEPAM 2 MG/ML IJ SOLN: 1.0000 mg | Freq: Four times a day (QID) | INTRAMUSCULAR | Status: AC | PRN

## 2018-06-20 NOTE — ED Provider Notes (Signed)
Emergency Department Provider Note   I have reviewed the triage vital signs and the nursing notes.   HISTORY  Chief Complaint Chest Pain   HPI Carl Evans is a 48 y.o. male who presents to the emergency department today with a couple weeks of intermittent progressively worsening left-sided chest pain with shortness of breath.  States it feels sharp and dull at the same time.  Not related to exertion.  Patient with a productive cough during this time as well.  Some fever but not measured.  Some nausea no vomiting.  No diarrhea or constipation.  No rashes.  No other associated symptoms.  No sick contacts.   No other associated or modifying symptoms.    History reviewed. No pertinent past medical history.  Patient Active Problem List   Diagnosis Date Noted   CAP (community acquired pneumonia) 06/20/2018    History reviewed. No pertinent surgical history.  Current Outpatient Rx   Order #: [id number] Class: Print    Allergies Patient has no known allergies.  No family history on file.  Social History Social History   Tobacco Use   Smoking status: Current Every Day Smoker   Smokeless tobacco: Never Used  Substance Use Topics   Alcohol use: Yes   Drug use: Yes    Types: Marijuana    Review of Systems  All other systems negative except as documented in the HPI. All pertinent positives and negatives as reviewed in the HPI. ____________________________________________   PHYSICAL EXAM:  VITAL SIGNS: ED Triage Vitals  Enc Vitals Group     BP 06/20/18 0130 (!) 128/94     Pulse Rate 06/20/18 0130 (!) 125     Resp 06/20/18 0130 (!) 27     Temp 06/20/18 0130 98.6 F (37 C)     Temp Source 06/20/18 0130 Oral     SpO2 06/20/18 0130 99 %     Weight 06/20/18 0129 110 lb (49.9 kg)     Height 06/20/18 0129 5\' 8"  (1.727 m)    Constitutional: Alert and oriented. Well appearing and in no acute distress.  Foul-smelling breath. eyes: Conjunctivae are normal.  PERRL. EOMI. Head: Atraumatic. Nose: No congestion/rhinnorhea. Mouth/Throat: Mucous membranes are moist.  Oropharynx non-erythematous. Neck: No stridor.  No meningeal signs.   Cardiovascular: Tachycardic rate, regular rhythm. Good peripheral circulation. Grossly normal heart sounds.   Respiratory: Tachypneic respiratory effort.  No retractions. Lungs CTAB. Gastrointestinal: Soft and nontender. No distention.  Musculoskeletal: No lower extremity tenderness nor edema. No gross deformities of extremities. Neurologic:  Normal speech and language. No gross focal neurologic deficits are appreciated.  Skin:  Skin is warm, dry and intact. No rash noted.  ____________________________________________   LABS (all labs ordered are listed, but only abnormal results are displayed)  Labs Reviewed  COMPREHENSIVE METABOLIC PANEL - Abnormal; Notable for the following components:      Result Value   Sodium 124 (*)    Potassium 3.2 (*)    Chloride 83 (*)    Glucose, Bld 130 (*)    Calcium 8.5 (*)    Albumin 2.5 (*)    AST 14 (*)    Anion gap 17 (*)    All other components within normal limits  CBC - Abnormal; Notable for the following components:   WBC 27.1 (*)    RBC 3.76 (*)    Hemoglobin 11.7 (*)    HCT 32.8 (*)    Platelets 512 (*)    All other components within normal  limits  D-DIMER, QUANTITATIVE (NOT AT [facility]) - Abnormal; Notable for the following components:   D-Dimer, Quant 1.63 (*)    All other components within normal limits  CBG MONITORING, ED - Abnormal; Notable for the following components:   Glucose-Capillary 117 (*)    All other components within normal limits  SARS CORONAVIRUS 2 (HOSPITAL ORDER, PERFORMED IN [facility])  EXPECTORATED SPUTUM ASSESSMENT W REFEX TO RESP CULTURE  GRAM STAIN  TROPONIN I  HIV ANTIBODY (ROUTINE TESTING W REFLEX)  STREP PNEUMONIAE URINARY ANTIGEN  LEGIONELLA PNEUMOPHILA SEROGP 1 UR AG  MAGNESIUM  PHOSPHORUS  BASIC METABOLIC PANEL     ____________________________________________  EKG   EKG Interpretation  Date/Time:  Friday Jun 20 2018 01:30:14 EDT Ventricular Rate:  122 PR Interval:    QRS Duration: 79 QT Interval:  316 QTC Calculation: 451 R Axis:   80 Text Interpretation:  Sinus tachycardia Multiform ventricular premature complexes Aberrant complex LAE, consider biatrial enlargement Borderline repolarization abnormality No significant change since last tracing Confirmed by Marily Memos [phone]) on 06/20/2018 1:32:41 AM      ____________________________________________  RADIOLOGY  Dg Chest 2 View  Result Date: 06/20/2018 CLINICAL DATA:  Left-sided chest pain. Cough and shortness of breath. EXAM: CHEST - 2 VIEW COMPARISON:  Radiograph 01/25/2014 FINDINGS: Ill-defined left perihilar density with central lucency, suspicious for necrotic process. Right lung is clear. Heart is normal in size. No pulmonary edema, pleural effusion, or pneumothorax. No acute osseous abnormalities are seen. IMPRESSION: Ill-defined left perihilar density with central lucency, suspicious for cavitary process. Recommend chest CT, preferably with contrast for further characterization. Differential considerations based on radiograph include pneumonia or mass, possibly necrotic. Electronically Signed   By: Narda Rutherford M.D.   On: 06/20/2018 02:51   Ct Angio Chest Pe W And/or Wo Contrast  Result Date: 06/20/2018 CLINICAL DATA:  Chest pain with elevated D-dimer. EXAM: CT ANGIOGRAPHY CHEST WITH CONTRAST TECHNIQUE: Multidetector CT imaging of the chest was performed using the standard protocol during bolus administration of intravenous contrast. Multiplanar CT image reconstructions and MIPs were obtained to evaluate the vascular anatomy. CONTRAST:  OMNIPAQUE IOHEXOL 350 MG/ML SOLN COMPARISON:  CTA chest 06/22/2009 FINDINGS: Cardiovascular: --Pulmonary arteries: Contrast injection is sufficient to demonstrate satisfactory opacification of  the pulmonary arteries to the segmental level, with attenuation of 477 HU at the main pulmonary artery. There is no pulmonary embolus. The main pulmonary artery is within normal limits for size. --Aorta: Limited opacification of the aorta due to bolus timing optimization for the pulmonary arteries. Conventional 3 vessel aortic branching pattern. The aortic course and caliber are normal. There is no aortic atherosclerosis. --Heart: Normal size. No pericardial effusion. Mediastinum/Nodes: No mediastinal, hilar or axillary lymphadenopathy. The visualized thyroid and thoracic esophageal course are unremarkable. Lungs/Pleura: There is a large area of cavitation within the left lower lobe that measures 5.4 x 5.6 x 7.2 cm. The lungs are otherwise clear. No pleural effusion or pneumothorax. Upper Abdomen: Contrast bolus timing is not optimized for evaluation of the abdominal organs. Within this limitation, the visualized organs of the upper abdomen are normal. Musculoskeletal: No chest wall abnormality. No acute or significant osseous findings. Review of the MIP images confirms the above findings. IMPRESSION: 1. No pulmonary embolus. 2. Large cavitary lesion of the left lower lobe, most consistent with necrotizing infection. A necrotic mass is a secondary consideration. Electronically Signed   By: Deatra Robinson M.D.   On: 06/20/2018 03:32    ____________________________________________   PROCEDURES  Procedure(s) performed:   Procedures   ____________________________________________   INITIAL IMPRESSION / ASSESSMENT AND PLAN / ED COURSE  Work-up ultimately revealed some type of necrotic abnormality with his left lung.  Concerning for likely infection or neoplasm.  His sodium is a bit low and his platelets are elevated and potassium slightly low as well.  This could be a paraneoplastic syndrome or could be a atypical infection like Legionella.  Already covered with antibiotics but secondary to his  tachycardia tachypnea will discuss with medicine for observation and pulmonary consult.  Pertinent labs & imaging results that were available during my care of the patient were reviewed by me and considered in my medical decision making (see chart for details).  ____________________________________________  FINAL CLINICAL IMPRESSION(S) / ED DIAGNOSES  Final diagnoses:  None     MEDICATIONS GIVEN DURING THIS VISIT:  Medications  azithromycin (ZITHROMAX) 500 mg in sodium chloride 0.9 % 250 mL IVPB (500 mg Intravenous New Bag/Given 06/20/18 0350)  0.9 %  sodium chloride infusion (250 mLs Intravenous New Bag/Given 06/20/18 0324)  cefTRIAXone (ROCEPHIN) 1 g in sodium chloride 0.9 % 100 mL IVPB (has no administration in time range)  azithromycin (ZITHROMAX) 500 mg in sodium chloride 0.9 % 250 mL IVPB (has no administration in time range)  lactated ringers bolus 1,000 mL (0 mLs Intravenous Stopped 06/20/18 0311)  cefTRIAXone (ROCEPHIN) 1 g in sodium chloride 0.9 % 100 mL IVPB (0 g Intravenous Stopped 06/20/18 0345)  iohexol (OMNIPAQUE) 350 MG/ML injection 100 mL (100 mLs Intravenous Contrast Given 06/20/18 0312)     NEW OUTPATIENT MEDICATIONS STARTED DURING THIS VISIT:  New Prescriptions   No medications on file    Note:  This note was prepared with assistance of Dragon voice recognition software. Occasional wrong-word or sound-a-like substitutions may have occurred due to the inherent limitations of voice recognition software.   Farin Buhman, Barbara Cower, MD 06/20/18 [phone]

## 2018-06-20 NOTE — H&P (Signed)
History and Physical    Carl Evans WJX:[id number] DOB: October 02, 1970 DOA: 06/20/2018  PCP: Patient, No Pcp Per  Patient coming from: Home.  I have personally briefly reviewed patient's old medical records in [facility]  Chief Complaint: Chest pain.  HPI: Carl Evans is a 48 y.o. male with medical history significant of alcohol, tobacco abuse who is coming to the emergency department with complaints of progressively worse chest pain associated with dyspnea for at least 2 weeks.  He has been having cough, which is occasionally productive of yellowish sputum.  He denies fever, rhinorrhea, sore throat, night sweats, wheezing or hemoptysis.  He denies sick contacts or travel history.  The chest pain is pleuritic without dizziness, palpitations, diaphoresis, orthopnea or pitting edema of the lower extremities.  The patient also states that he has lost about 15 pounds of weight in the last month and 1/2 to 2 months.  He also complains that in the last 2 to 3 days he has been having episodes of persistent singultus for which he has to self-induced vomiting to make it better.  He states that he has to induce vomiting 3 times over the last 2 days.  He denies diarrhea, constipation, melena or hematochezia.  He denies dysuria, frequency or hematuria.  He denies polyuria, polydipsia, polyphagia or blurred vision.  ED Course: The patient was given ceftriaxone and azithromycin in the emergency department.  His white count is 27.1, hemoglobin 11.7 g/dL and platelets 213.  D-dimer is 1.63.  Troponin was normal.  Sodium 124, potassium 3.2, chloride 83 and CO2 24 mmol/L.  Glucose 130, BUN 18, creatinine 1.07 and calcium 8.5 mg/dL.  Albumin was 2.5 g/dL, the rest of the LFTs are unremarkable.  SARS coronavirus 2 nasal swab was negative.  Imaging: Chest radiograph and CTA showed left lower lobe cavitary pneumonia.  Review of Systems: As per HPI otherwise 10 point review of systems negative.  Past Medical  History:  Diagnosis Date  . Alcohol abuse 06/20/2018  . Tobacco use 06/20/2018    History reviewed. No pertinent surgical history.   reports that he has been smoking. He has never used smokeless tobacco. He reports current alcohol use. He reports current drug use. Drug: Marijuana.  No Known Allergies  Family History  Problem Relation Age of Onset  . Thyroid disease Mother   . Kidney disease Father   . Thyroid disease Sister    Prior to Admission medications   Medication Sig Start Date End Date Taking? Authorizing Provider  naproxen (NAPROSYN) 500 MG tablet Take 1 tablet (500 mg total) by mouth 2 (two) times daily. 01/25/14   Dione Booze, MD    Physical Exam: Vitals:   06/20/18 0430 06/20/18 0500 06/20/18 0530 06/20/18 0615  BP: 111/85 108/78 (!) 125/95 110/75  Pulse: (!) 103 (!) 105 (!) 118 (!) 108  Resp: (!) 32 (!) 29 (!) 29 16  Temp:    98.2 F (36.8 C)  TempSrc:    Oral  SpO2: 100% 100% 100% 100%  Weight:    42.3 kg  Height:        Constitutional: Looks chronically ill, but in NAD, calm, comfortable Eyes: PERRL, lids and conjunctivae mildly injected. ENMT: Mucous membranes are mildly dry. Posterior pharynx clear of any exudate or lesions.  Partially absent and in poor state of repair dentition. Neck: normal, supple, no masses, no thyromegaly Respiratory: Decreased breath sounds and crackles on left base, mild bilateral scattered rhonchi, no wheezing, no crackles. Normal  respiratory effort. No accessory muscle use.  Cardiovascular: Regular rate and rhythm, no murmurs / rubs / gallops. No extremity edema. 2+ pedal pulses. No carotid bruits.  Abdomen: no tenderness, no masses palpated. No hepatosplenomegaly. Bowel sounds positive.  Musculoskeletal: no clubbing / cyanosis. Good ROM, no contractures. Normal muscle tone.  Skin: no rashes, lesions, ulcers on limited dermatological examination. Neurologic: CN 2-12 grossly intact. Sensation intact, DTR normal. Strength 5/5 in  all 4.  Psychiatric: Normal judgment and insight. Alert and oriented x 3. Normal mood.   Labs on Admission: I have personally reviewed following labs and imaging studies  CBC: Recent Labs  Lab 06/20/18 0152  WBC 27.1*  HGB 11.7*  HCT 32.8*  MCV 87.2  PLT 512*   Basic Metabolic Panel: Recent Labs  Lab 06/20/18 0152  NA 124*  K 3.2*  CL 83*  CO2 24  GLUCOSE 130*  BUN 18  CREATININE 1.07  CALCIUM 8.5*  MG 1.7  PHOS 3.0   GFR: Estimated Creatinine Clearance: 50 mL/min (by C-G formula based on SCr of 1.07 mg/dL). Liver Function Tests: Recent Labs  Lab 06/20/18 0152  AST 14*  ALT 14  ALKPHOS 76  BILITOT 0.9  PROT 7.4  ALBUMIN 2.5*   No results for input(s): LIPASE, AMYLASE in the last 168 hours. No results for input(s): AMMONIA in the last 168 hours. Coagulation Profile: No results for input(s): INR, PROTIME in the last 168 hours. Cardiac Enzymes: Recent Labs  Lab 06/20/18 0152  TROPONINI <0.03   BNP (last 3 results) No results for input(s): PROBNP in the last 8760 hours. HbA1C: No results for input(s): HGBA1C in the last 72 hours. CBG: Recent Labs  Lab 06/20/18 0157  GLUCAP 117*   Lipid Profile: No results for input(s): CHOL, HDL, LDLCALC, TRIG, CHOLHDL, LDLDIRECT in the last 72 hours. Thyroid Function Tests: No results for input(s): TSH, T4TOTAL, FREET4, T3FREE, THYROIDAB in the last 72 hours. Anemia Panel: No results for input(s): VITAMINB12, FOLATE, FERRITIN, TIBC, IRON, RETICCTPCT in the last 72 hours. Urine analysis: No results found for: COLORURINE, APPEARANCEUR, LABSPEC, PHURINE, GLUCOSEU, HGBUR, BILIRUBINUR, KETONESUR, PROTEINUR, UROBILINOGEN, NITRITE, LEUKOCYTESUR  Radiological Exams on Admission: Dg Chest 2 View  Result Date: 06/20/2018 CLINICAL DATA:  Left-sided chest pain. Cough and shortness of breath. EXAM: CHEST - 2 VIEW COMPARISON:  Radiograph 01/25/2014 FINDINGS: Ill-defined left perihilar density with central lucency, suspicious  for necrotic process. Right lung is clear. Heart is normal in size. No pulmonary edema, pleural effusion, or pneumothorax. No acute osseous abnormalities are seen. IMPRESSION: Ill-defined left perihilar density with central lucency, suspicious for cavitary process. Recommend chest CT, preferably with contrast for further characterization. Differential considerations based on radiograph include pneumonia or mass, possibly necrotic. Electronically Signed   By: Narda RutherfordMelanie  Sanford M.D.   On: 06/20/2018 02:51   Ct Angio Chest Pe W And/or Wo Contrast  Result Date: 06/20/2018 CLINICAL DATA:  Chest pain with elevated D-dimer. EXAM: CT ANGIOGRAPHY CHEST WITH CONTRAST TECHNIQUE: Multidetector CT imaging of the chest was performed using the standard protocol during bolus administration of intravenous contrast. Multiplanar CT image reconstructions and MIPs were obtained to evaluate the vascular anatomy. CONTRAST:  100mL OMNIPAQUE IOHEXOL 350 MG/ML SOLN COMPARISON:  CTA chest 06/22/2009 FINDINGS: Cardiovascular: --Pulmonary arteries: Contrast injection is sufficient to demonstrate satisfactory opacification of the pulmonary arteries to the segmental level, with attenuation of 477 HU at the main pulmonary artery. There is no pulmonary embolus. The main pulmonary artery is within normal limits for size. --Aorta:  Limited opacification of the aorta due to bolus timing optimization for the pulmonary arteries. Conventional 3 vessel aortic branching pattern. The aortic course and caliber are normal. There is no aortic atherosclerosis. --Heart: Normal size. No pericardial effusion. Mediastinum/Nodes: No mediastinal, hilar or axillary lymphadenopathy. The visualized thyroid and thoracic esophageal course are unremarkable. Lungs/Pleura: There is a large area of cavitation within the left lower lobe that measures 5.4 x 5.6 x 7.2 cm. The lungs are otherwise clear. No pleural effusion or pneumothorax. Upper Abdomen: Contrast bolus timing  is not optimized for evaluation of the abdominal organs. Within this limitation, the visualized organs of the upper abdomen are normal. Musculoskeletal: No chest wall abnormality. No acute or significant osseous findings. Review of the MIP images confirms the above findings. IMPRESSION: 1. No pulmonary embolus. 2. Large cavitary lesion of the left lower lobe, most consistent with necrotizing infection. A necrotic mass is a secondary consideration. Electronically Signed   By: Deatra Robinson M.D.   On: 06/20/2018 03:32    EKG: Independently reviewed.  Vent. rate 122 BPM PR interval * ms QRS duration 79 ms QT/QTc 316/451 ms P-R-T axes 76 80 * Sinus tachycardia Multiform ventricular premature complexes Aberrant complex LAE, consider biatrial enlargement Borderline repolarization abnormality  Assessment/Plan Principal Problem:   CAP (community acquired pneumonia) Admit to telemetry/inpatient. Continue supplemental oxygen. Bronchodilators as needed. Continue ceftriaxone 1 g IVPB every 24 hours. Continue azithromycin 500 mg IVPB every 24 hours. Check sputum Gram stain, culture and sensitivity. Check sputum cytology. Given cavitation, presence of singultus will consult pulmonology.  Active Problems:   Hypokalemia Secondary to emesis. Continue potassium supplementation. Follow-up level after replacement.    Hyponatremia Likely due to GI losses. Check serum osmolality. Check random urine sodium. Continue normal saline infusion. Follow-up sodium level. I doubt that this is Legionnaires' disease.  However Consider checking Legionella urinary antigen if further work-up needed    Anemia Monitor H&H.    Tobacco use Declined nicotine replacement therapy for now. Staff to provide tobacco cessation information.    Alcohol abuse CIWA protocol. Folate, MVI and thiamine supplementation.   DVT prophylaxis: Lovenox SQ. Code Status: Full code. Family Communication:  Disposition Plan:  Admit for IV antibiotic therapy and further work-up. Consults called: Routine pulmonology consult. Admission status: Inpatient/telemetry.   Bobette Mo MD [facility]  06/20/2018, 6:43 AM   This document was prepared using Dragon voice recognition software and may contain some unintended transcription errors.

## 2018-06-20 NOTE — Progress Notes (Signed)
Shin seen and examined.  Admitted after midnight secondary to pleuritic chest discomfort, productive cough, fever and shortness of breath.  Patient with underlying history of alcohol abuse and in the last 2 to 3 days has been having episode of persistent early satiety and full sensation in his abdomen for what he has self-induced vomiting to make it feel better.  Patient is hemodynamically stable on my examination.  Very poor dentition, still spiking temperature, and asking for something to eat.  Reports slight feeling of nausea but no further vomiting.  Please refer to H&P written by Dr. Robb Matar for further info/details on admission.  Plan: -Continue IV antibiotics focusing on atypical and anaerobes coverage (cavitation lesion seen on x-ray increasing concerns for anaerobes/aspiration) -Continue IV fluids -Advance diet to full liquid -Continue monitoring on CIWA -Alcohol cessation counseling and tobacco cessation counseling provided -Continue folic acid and thiamine -Continue supportive care and follow clinical response.  Vassie Loll MD [phone]

## 2018-06-20 NOTE — ED Triage Notes (Signed)
Pt in by [facility] for chest pain off and on for a couple of weeks.  Pt received 324 mg of aspirin and 1 nitro per ems with improvement of pain

## 2018-06-21 DIAGNOSIS — E43 Unspecified severe protein-calorie malnutrition: Secondary | ICD-10-CM

## 2018-06-21 DIAGNOSIS — R0781 Pleurodynia: Secondary | ICD-10-CM

## 2018-06-21 LAB — C DIFFICILE QUICK SCREEN W PCR REFLEX
C Diff antigen: NEGATIVE
C Diff interpretation: NOT DETECTED
C Diff toxin: NEGATIVE

## 2018-06-21 LAB — BASIC METABOLIC PANEL
Anion gap: 9 (ref 5–15)
BUN: 9 mg/dL (ref 6–20)
CO2: 26 mmol/L (ref 22–32)
Calcium: 7.9 mg/dL — ABNORMAL LOW (ref 8.9–10.3)
Chloride: 98 mmol/L (ref 98–111)
Creatinine, Ser: 0.83 mg/dL (ref 0.61–1.24)
GFR calc Af Amer: >60 mL/min (ref >60)
GFR calc non Af Amer: >60 mL/min (ref >60)
Glucose, Bld: 96 mg/dL (ref 70–99)
Potassium: 3.4 mmol/L — ABNORMAL LOW (ref 3.5–5.1)
Sodium: 133 mmol/L — ABNORMAL LOW (ref 135–145)

## 2018-06-21 LAB — HIV ANTIBODY (ROUTINE TESTING W REFLEX): HIV Screen 4th Generation wRfx: NONREACTIVE

## 2018-06-21 MED ORDER — ENSURE ENLIVE PO LIQD
237.0000 mL | Freq: Two times a day (BID) | ORAL | Status: DC
  Administered 2018-06-21 – 2018-06-23 (×4): 237 mL via ORAL

## 2018-06-21 MED ORDER — SACCHAROMYCES BOULARDII 250 MG PO CAPS
250.0000 mg | ORAL_CAPSULE | Freq: Two times a day (BID) | ORAL | Status: DC
  Administered 2018-06-21 – 2018-06-23 (×5): 250 mg via ORAL
  Filled 2018-06-21 (×5): qty 1

## 2018-06-21 MED ORDER — SODIUM CHLORIDE 0.9 % IV BOLUS
500.0000 mL | Freq: Once | INTRAVENOUS | Status: AC
  Administered 2018-06-21: 500 mL via INTRAVENOUS

## 2018-06-21 NOTE — Progress Notes (Signed)
MD placed order for cdiff sample,  bolus of normal saline one time. Educated patient on reason for stool sample. Placed hat in commode for collection. Will continue to monitor throughout shift.

## 2018-06-21 NOTE — Progress Notes (Signed)
Patient had loose stool this am per patient patient has had several loose stools. Paged MD to make aware. No new orders at this time. Will continue to monitor throughout shift.

## 2018-06-21 NOTE — Progress Notes (Signed)
PROGRESS NOTE    Carl HabermannGary W Evans  ZOX:[id number] DOB: [birth year] DOA: 06/20/2018 PCP: Patient, No Pcp Per     Brief Narrative:  48 y.o. male with medical history significant of alcohol, tobacco abuse who is coming to the emergency department with complaints of progressively worse chest pain associated with dyspnea for at least 2 weeks.  He has been having cough, which is occasionally productive of yellowish sputum.  He denies fever, rhinorrhea, sore throat, night sweats, wheezing or hemoptysis.  He denies sick contacts or travel history.  The chest pain is pleuritic without dizziness, palpitations, diaphoresis, orthopnea or pitting edema of the lower extremities.  The patient also states that he has lost about 15 pounds of weight in the last month and 1/2 to 2 months.  He also complains that in the last 2 to 3 days he has been having episodes of persistent singultus for which he has to self-induced vomiting to make it better.  He states that he has to induce vomiting 3 times over the last 2 days.  He denies diarrhea, constipation, melena or hematochezia.  He denies dysuria, frequency or hematuria.  He denies polyuria, polydipsia, polyphagia or blurred vision.   Assessment & Plan: 1-sepsis secondary to pneumonia: CAP (community acquired pneumonia) versus aspiration. -Continue current IV antibiotics -Follow cultures results and sensitivity -Continue as needed bronchodilators -Continue IV fluids. -Cavitary lesion appreciated on imaging studies -Patient with risk factors for malignancy given tobacco abuse. -Will discuss with pulmonologist  2-hyponatremia -In the setting of dehydration and chronic alcohol abuse -Improved with fluid resuscitation -Alcohol cessation has been provided -Will advance diet and follow electrolytes.  3-Hypokalemia -In the setting of poor oral intake and GI losses -Will repeat and follow electrolytes trend -No abnormalities on telemetry.  4-tobacco abuse -Cessation  counseling has been provided -Nicotine patch has been ordered.  5-alcohol abuse -Continue monitoring on CIWA protocol -Continue folic acid, multivitamin and thiamine supplementation. -Check B12 level.  6-severe protein calorie malnutrition -Dietitian service has been consulted and will follow recommendations for feeding supplements. -Body mass index is 14.18 kg/m.   DVT prophylaxis: SCDs Code Status: Full code Family Communication: No family at bedside. Disposition Plan: Continue IV antibiotics, start advancing diet, use Florastor, follow clinical response.  Still with low-grade temperature.  C. difficile negative.  Consultants:   None  Procedures:   See below for x-ray reports.   Antimicrobials:  Anti-infectives (From admission, onward)   Start     Dose/Rate Route Frequency Ordered Stop   06/21/18 0400  azithromycin (ZITHROMAX) 500 mg in sodium chloride 0.9 % 250 mL IVPB     500 mg 250 mL/hr over 60 Minutes Intravenous Every 24 hours 06/20/18 0427 06/27/18 0359   06/21/18 0000  cefTRIAXone (ROCEPHIN) 1 g in sodium chloride 0.9 % 100 mL IVPB  Status:  Discontinued     1 g 200 mL/hr over 30 Minutes Intravenous Every 24 hours 06/20/18 0427 06/20/18 0800   06/20/18 0815  piperacillin-tazobactam (ZOSYN) IVPB 3.375 g     3.375 g 12.5 mL/hr over 240 Minutes Intravenous Every 8 hours 06/20/18 0800     06/20/18 0300  cefTRIAXone (ROCEPHIN) 1 g in sodium chloride 0.9 % 100 mL IVPB     1 g 200 mL/hr over 30 Minutes Intravenous  Once 06/20/18 0250 06/20/18 0345   06/20/18 0300  azithromycin (ZITHROMAX) 500 mg in sodium chloride 0.9 % 250 mL IVPB     500 mg 250 mL/hr over 60 Minutes Intravenous  Once 06/20/18  0250 06/20/18 0522      Subjective: Currently afebrile; patient is spiked low-grade temperature.  Reports improvement in breathing and chest pain.  No further nausea or vomiting. Good O2 sat on RA. Reports appetite is getting better. Still productive cough and tachypnea with  activity. Overnight with complaint of diarrhea.   Objective: Vitals:   06/20/18 2138 06/21/18 0036 06/21/18 0601 06/21/18 0817  BP: 100/68 98/73 113/71   Pulse: 93 97 89   Resp: 16 17 16    Temp: 98.6 F (37 C) 99.2 F (37.3 C) 98.1 F (36.7 C)   TempSrc: Oral Oral Oral   SpO2: 100% 99% 100% 99%  Weight:      Height:        Intake/Output Summary (Last 24 hours) at 06/21/2018 1447 Last data filed at 06/21/2018 1343 Gross per 24 hour  Intake 2608.12 ml  Output --  Net 2608.12 ml   Filed Weights   06/20/18 0129 06/20/18 0615  Weight: 49.9 kg 42.3 kg    Examination: General exam: Alert, awake, oriented x 3, low-grade fever overnight, couple episodes of diarrhea, reports no further nausea or vomiting.  Appetite improving.  Reports slight improvement in his chest pain and is breathing easier. Chronically ill and underweight in appearance. Respiratory system: Positive rhonchi, no wheezing, Respiratory effort normal. No using accessory muscles or requiring oxygen supplementation. Cardiovascular system:RRR. No murmurs, rubs, gallops. Gastrointestinal system: Abdomen is nondistended, soft and nontender. No organomegaly or masses felt. Normal bowel sounds heard. Central nervous system: Alert and oriented. No focal neurological deficits. Extremities: No C/C/E, +pedal pulses Skin: No rashes, lesions or ulcers Psychiatry: Judgement and insight appear normal. Mood & affect appropriate.    Data Reviewed: I have personally reviewed following labs and imaging studies  CBC: Recent Labs  Lab 06/20/18 0152  WBC 27.1*  HGB 11.7*  HCT 32.8*  MCV 87.2  PLT 512*   Basic Metabolic Panel: Recent Labs  Lab 06/20/18 0152 06/20/18 1114 06/21/18 0610  NA 124* 128* 133*  K 3.2* 3.7 3.4*  CL 83* 91* 98  CO2 24 25 26   GLUCOSE 130* 103* 96  BUN 18 14 9   CREATININE 1.07 0.83 0.83  CALCIUM 8.5* 8.4* 7.9*  MG 1.7  --   --   PHOS 3.0  --   --    GFR: Estimated Creatinine Clearance: 64.4  mL/min (by C-G formula based on SCr of 0.83 mg/dL).   Liver Function Tests: Recent Labs  Lab 06/20/18 0152  AST 14*  ALT 14  ALKPHOS 76  BILITOT 0.9  PROT 7.4  ALBUMIN 2.5*   Cardiac Enzymes: Recent Labs  Lab 06/20/18 0152  TROPONINI <0.03   CBG: Recent Labs  Lab 06/20/18 0157  GLUCAP 117*   Recent Results (from the past 240 hour(s))  SARS Coronavirus 2 (CEPHEID- Performed in [facility] hospital lab), Hosp Order     Status: None   Collection Time: 06/20/18  4:00 AM  Result Value Ref Range Status   SARS Coronavirus 2 NEGATIVE NEGATIVE Final    Comment: (NOTE) If result is NEGATIVE SARS-CoV-2 target nucleic acids are NOT DETECTED. The SARS-CoV-2 RNA is generally detectable in upper and lower  respiratory specimens during the acute phase of infection. The lowest  concentration of SARS-CoV-2 viral copies this assay can detect is 250  copies / mL. A negative result does not preclude SARS-CoV-2 infection  and should not be used as the sole basis for treatment or other  patient management decisions.  A negative result may occur with  improper specimen collection / handling, submission of specimen other  than nasopharyngeal swab, presence of viral mutation(s) within the  areas targeted by this assay, and inadequate number of viral copies  (<250 copies / mL). A negative result must be combined with clinical  observations, patient history, and epidemiological information. If result is POSITIVE SARS-CoV-2 target nucleic acids are DETECTED. The SARS-CoV-2 RNA is generally detectable in upper and lower  respiratory specimens dur ing the acute phase of infection.  Positive  results are indicative of active infection with SARS-CoV-2.  Clinical  correlation with patient history and other diagnostic information is  necessary to determine patient infection status.  Positive results do  not rule out bacterial infection or co-infection with other viruses. If result is PRESUMPTIVE  POSTIVE SARS-CoV-2 nucleic acids MAY BE PRESENT.   A presumptive positive result was obtained on the submitted specimen  and confirmed on repeat testing.  While 2019 novel coronavirus  (SARS-CoV-2) nucleic acids may be present in the submitted sample  additional confirmatory testing may be necessary for epidemiological  and / or clinical management purposes  to differentiate between  SARS-CoV-2 and other Sarbecovirus currently known to infect humans.  If clinically indicated additional testing with an alternate test  methodology [phone]) is advised. The SARS-CoV-2 RNA is generally  detectable in upper and lower respiratory sp ecimens during the acute  phase of infection. The expected result is Negative. Fact Sheet for Patients:  BoilerBrush.com.cy Fact Sheet for Healthcare Providers: https://pope.com/ This test is not yet approved or cleared by the [place] FDA and has been authorized for detection and/or diagnosis of SARS-CoV-2 by FDA under an Emergency Use Authorization (EUA).  This EUA will remain in effect (meaning this test can be used) for the duration of the COVID-19 declaration under Section 564(b)(1) of the Act, 21 U.S.C. section 360bbb-3(b)(1), unless the authorization is terminated or revoked sooner. Performed at [facility], [address] [place], [place] [address]   C difficile quick scan w PCR reflex     Status: None   Collection Time: 06/21/18  4:40 AM  Result Value Ref Range Status   C Diff antigen NEGATIVE NEGATIVE Final   C Diff toxin NEGATIVE NEGATIVE Final   C Diff interpretation No C. difficile detected.  Final    Comment: Performed at [facility], [address], [place], [place] [address]     Radiology Studies: Dg Chest 2 View  Result Date: 06/20/2018 CLINICAL DATA:  Left-sided chest pain. Cough and shortness of breath. EXAM: CHEST - 2 VIEW COMPARISON:  Radiograph 01/25/2014 FINDINGS: Ill-defined left  perihilar density with central lucency, suspicious for necrotic process. Right lung is clear. Heart is normal in size. No pulmonary edema, pleural effusion, or pneumothorax. No acute osseous abnormalities are seen. IMPRESSION: Ill-defined left perihilar density with central lucency, suspicious for cavitary process. Recommend chest CT, preferably with contrast for further characterization. Differential considerations based on radiograph include pneumonia or mass, possibly necrotic. Electronically Signed   By: Narda Rutherford M.D.   On: 06/20/2018 02:51   Ct Angio Chest Pe W And/or Wo Contrast  Result Date: 06/20/2018 CLINICAL DATA:  Chest pain with elevated D-dimer. EXAM: CT ANGIOGRAPHY CHEST WITH CONTRAST TECHNIQUE: Multidetector CT imaging of the chest was performed using the standard protocol during bolus administration of intravenous contrast. Multiplanar CT image reconstructions and MIPs were obtained to evaluate the vascular anatomy. CONTRAST:  OMNIPAQUE IOHEXOL 350 MG/ML SOLN COMPARISON:  CTA chest  06/22/2009 FINDINGS: Cardiovascular: --Pulmonary arteries: Contrast injection is sufficient to demonstrate satisfactory opacification of the pulmonary arteries to the segmental level, with attenuation of 477 HU at the main pulmonary artery. There is no pulmonary embolus. The main pulmonary artery is within normal limits for size. --Aorta: Limited opacification of the aorta due to bolus timing optimization for the pulmonary arteries. Conventional 3 vessel aortic branching pattern. The aortic course and caliber are normal. There is no aortic atherosclerosis. --Heart: Normal size. No pericardial effusion. Mediastinum/Nodes: No mediastinal, hilar or axillary lymphadenopathy. The visualized thyroid and thoracic esophageal course are unremarkable. Lungs/Pleura: There is a large area of cavitation within the left lower lobe that measures 5.4 x 5.6 x 7.2 cm. The lungs are otherwise clear. No pleural effusion or  pneumothorax. Upper Abdomen: Contrast bolus timing is not optimized for evaluation of the abdominal organs. Within this limitation, the visualized organs of the upper abdomen are normal. Musculoskeletal: No chest wall abnormality. No acute or significant osseous findings. Review of the MIP images confirms the above findings. IMPRESSION: 1. No pulmonary embolus. 2. Large cavitary lesion of the left lower lobe, most consistent with necrotizing infection. A necrotic mass is a secondary consideration. Electronically Signed   By: Deatra Robinson M.D.   On: 06/20/2018 03:32    Scheduled Meds:  folic acid  1 mg Oral Daily   LORazepam  0-4 mg Intravenous Q6H   Followed by   Melene Muller ON 06/22/2018] LORazepam  0-4 mg Intravenous Q12H   multivitamin with minerals  1 tablet Oral Daily   nicotine  21 mg Transdermal Daily   saccharomyces boulardii  250 mg Oral BID   thiamine  100 mg Oral Daily   Or   thiamine  100 mg Intravenous Daily   Continuous Infusions:  sodium chloride Stopped (06/20/18 0522)   0.9 % NaCl with KCl 40 mEq / L 125 mL/hr (06/21/18 1108)   azithromycin 500 mg (06/21/18 0400)   piperacillin-tazobactam 3.375 g (06/21/18 0610)     LOS: 1 day    Time spent: 30 minutes.    Vassie Loll, MD [facility] Pager [phone]   06/21/2018, 2:47 PM

## 2018-06-22 LAB — BASIC METABOLIC PANEL
Anion gap: 10 (ref 5–15)
BUN: 5 mg/dL — ABNORMAL LOW (ref 6–20)
CO2: 21 mmol/L — ABNORMAL LOW (ref 22–32)
Calcium: 8 mg/dL — ABNORMAL LOW (ref 8.9–10.3)
Chloride: 104 mmol/L (ref 98–111)
Creatinine, Ser: 0.86 mg/dL (ref 0.61–1.24)
GFR calc Af Amer: >60 mL/min (ref >60)
GFR calc non Af Amer: >60 mL/min (ref >60)
Glucose, Bld: 102 mg/dL — ABNORMAL HIGH (ref 70–99)
Potassium: 4.3 mmol/L (ref 3.5–5.1)
Sodium: 135 mmol/L (ref 135–145)

## 2018-06-22 LAB — CBC
HCT: 29.2 % — ABNORMAL LOW (ref 39.0–52.0)
Hemoglobin: 9.9 g/dL — ABNORMAL LOW (ref 13.0–17.0)
MCH: 31 pg (ref 26.0–34.0)
MCHC: 33.9 g/dL (ref 30.0–36.0)
MCV: 91.5 fL (ref 80.0–100.0)
Platelets: 479 10*3/uL — ABNORMAL HIGH (ref 150–400)
RBC: 3.19 MIL/uL — ABNORMAL LOW (ref 4.22–5.81)
RDW: 14 % (ref 11.5–15.5)
WBC: 13.8 10*3/uL — ABNORMAL HIGH (ref 4.0–10.5)
nRBC: 0 % (ref 0.0–0.2)

## 2018-06-22 LAB — SODIUM, URINE, RANDOM: Sodium, Ur: 118 mmol/L

## 2018-06-22 LAB — STREP PNEUMONIAE URINARY ANTIGEN: Strep Pneumo Urinary Antigen: NEGATIVE

## 2018-06-22 LAB — MAGNESIUM: Magnesium: 1.8 mg/dL (ref 1.7–2.4)

## 2018-06-22 LAB — VITAMIN B12: Vitamin B-12: 315 pg/mL (ref 180–914)

## 2018-06-22 MED ORDER — ALUM & MAG HYDROXIDE-SIMETH 200-200-20 MG/5ML PO SUSP
30.0000 mL | Freq: Four times a day (QID) | ORAL | Status: DC | PRN
  Administered 2018-06-22: 30 mL via ORAL
  Filled 2018-06-22: qty 30

## 2018-06-22 NOTE — Progress Notes (Signed)
PROGRESS NOTE    Bridgette HabermannGary W Zajicek  ZOX:[id number] DOB: [birth year] DOA: 06/20/2018 PCP: Patient, No Pcp Per     Brief Narrative:  48 y.o. male with medical history significant of alcohol, tobacco abuse who is coming to the emergency department with complaints of progressively worse chest pain associated with dyspnea for at least 2 weeks.  He has been having cough, which is occasionally productive of yellowish sputum.  He denies fever, rhinorrhea, sore throat, night sweats, wheezing or hemoptysis.  He denies sick contacts or travel history.  The chest pain is pleuritic without dizziness, palpitations, diaphoresis, orthopnea or pitting edema of the lower extremities.  The patient also states that he has lost about 15 pounds of weight in the last month and 1/2 to 2 months.  He also complains that in the last 2 to 3 days he has been having episodes of persistent singultus for which he has to self-induced vomiting to make it better.  He states that he has to induce vomiting 3 times over the last 2 days.  He denies diarrhea, constipation, melena or hematochezia.  He denies dysuria, frequency or hematuria.  He denies polyuria, polydipsia, polyphagia or blurred vision.   Assessment & Plan: 1-sepsis secondary to pneumonia: CAP (community acquired pneumonia) versus aspiration. -Continue current IV antibiotics -Follow cultures results and sensitivity -Continue as needed bronchodilators -Patient encouraged to increase oral hydration and p.o. intake. -Cavitary lesion appreciated on imaging studies -Patient with risk factors for malignancy given tobacco abuse. -Will discuss with pulmonologist to arrange outpatient follow-up and further imaging studies. -Duration 24 hours without fever.  If remains afebrile will discharge home with oral antibiotics. -Sepsis features essentially resolved.  2-hyponatremia -In the setting of dehydration and chronic alcohol abuse -Improved with fluid resuscitation -Alcohol  cessation has been provided -Will follow electrolytes in a.m.  3-Hypokalemia -In the setting of poor oral intake and GI losses -Status post repletion -Continue to follow electrolytes intermittently -No abnormalities on telemetry; will discontinue telemetry.Marland Kitchen.  4-tobacco abuse -Cessation counseling has been provided -Nicotine patch has been ordered.  5-alcohol abuse -Continue monitoring on CIWA protocol -Continue folic acid, multivitamin and thiamine supplementation. -B12 level within normal limits. -No withdrawal symptoms on exam.  6-severe protein calorie malnutrition -Dietitian service has been consulted and will follow recommendations for feeding supplements. -Body mass index is 14.18 kg/m.   DVT prophylaxis: SCDs Code Status: Full code Family Communication: No family at bedside. Disposition Plan: Continue IV antibiotics for another 24 hours, start advancing diet, use Florastor, follow clinical response.  Still with low-grade temperature.  C. difficile negative.  Discharged home on 06/23/2018 if remains afebrile.  Consultants:   None  Procedures:   See below for x-ray reports.   Antimicrobials:  Anti-infectives (From admission, onward)   Start     Dose/Rate Route Frequency Ordered Stop   06/21/18 0400  azithromycin (ZITHROMAX) 500 mg in sodium chloride 0.9 % 250 mL IVPB     500 mg 250 mL/hr over 60 Minutes Intravenous Every 24 hours 06/20/18 0427 06/27/18 0359   06/21/18 0000  cefTRIAXone (ROCEPHIN) 1 g in sodium chloride 0.9 % 100 mL IVPB  Status:  Discontinued     1 g 200 mL/hr over 30 Minutes Intravenous Every 24 hours 06/20/18 0427 06/20/18 0800   06/20/18 0815  piperacillin-tazobactam (ZOSYN) IVPB 3.375 g     3.375 g 12.5 mL/hr over 240 Minutes Intravenous Every 8 hours 06/20/18 0800     06/20/18 0300  cefTRIAXone (ROCEPHIN) 1 g in sodium  chloride 0.9 % 100 mL IVPB     1 g 200 mL/hr over 30 Minutes Intravenous  Once 06/20/18 0250 06/20/18 0345   06/20/18  0300  azithromycin (ZITHROMAX) 500 mg in sodium chloride 0.9 % 250 mL IVPB     500 mg 250 mL/hr over 60 Minutes Intravenous  Once 06/20/18 0250 06/20/18 0522      Subjective: Patient so far afebrile; no chest pain, breathing easier, still having intermittent episode of productive cough.  Appetite improving.  Objective: Vitals:   06/21/18 2206 06/22/18 0630 06/22/18 1202 06/22/18 1508  BP: 102/73 122/84 105/72 106/89  Pulse: 98 88 83 (!) 106  Resp: 20 (!) Temp: 98.1 F (36.7 C) 98.1 F (36.7 C) 98.4 F (36.9 C) 97.8 F (36.6 C)  TempSrc: Oral Oral Oral Oral  SpO2: 97% 100% 100% 100%  Weight:      Height:        Intake/Output Summary (Last 24 hours) at 06/22/2018 1652 Last data filed at 06/22/2018 0900 Gross per 24 hour  Intake 2815.91 ml  Output -  Net 2815.91 ml   Filed Weights   06/20/18 0129 06/20/18 0615  Weight: 49.9 kg 42.3 kg    Examination: General exam: Alert, awake, oriented x 3; afebrile; reports no nausea or vomiting.  Appetite continue improving.  Chronically ill and underweight in appearance.  Patient reports breathing much better and denies chest pain Respiratory system: No wheezing, positive rhonchi at, no using accessory muscles.  Good O2 sat on room air. Cardiovascular system:RRR. No murmurs, rubs, gallops. Gastrointestinal system: Abdomen is nondistended, soft and nontender. No organomegaly or masses felt. Normal bowel sounds heard. Central nervous system: Alert and oriented. No focal neurological deficits. Extremities: No C/C/E, +pedal pulses Skin: No rashes, lesions or ulcers Psychiatry: Judgement and insight appear normal. Mood & affect appropriate.    Data Reviewed: I have personally reviewed following labs and imaging studies  CBC: Recent Labs  Lab 06/20/18 0152 06/22/18 0550  WBC 27.1* 13.8*  HGB 11.7* 9.9*  HCT 32.8* 29.2*  MCV 87.2 91.5  PLT 512* 479*   Basic Metabolic Panel: Recent Labs  Lab 06/20/18 0152 06/20/18  1114 06/21/18 0610 06/22/18 0550  NA 124* 128* 133* 135  K 3.2* 3.7 3.4* 4.3  CL 83* 91* 98 104  CO2 21*  GLUCOSE 130* 103* 96 102*  BUN 5*  CREATININE 1.07 0.83 0.83 0.86  CALCIUM 8.5* 8.4* 7.9* 8.0*  MG 1.7  --   --  1.8  PHOS 3.0  --   --   --    GFR: Estimated Creatinine Clearance: 62.2 mL/min (by C-G formula based on SCr of 0.86 mg/dL).   Liver Function Tests: Recent Labs  Lab 06/20/18 0152  AST 14*  ALT 14  ALKPHOS 76  BILITOT 0.9  PROT 7.4  ALBUMIN 2.5*   Cardiac Enzymes: Recent Labs  Lab 06/20/18 0152  TROPONINI <0.03   CBG: Recent Labs  Lab 06/20/18 0157  GLUCAP 117*   Recent Results (from the past 240 hour(s))  SARS Coronavirus 2 (CEPHEID- Performed in [facility] hospital lab), Hosp Order     Status: None   Collection Time: 06/20/18  4:00 AM  Result Value Ref Range Status   SARS Coronavirus 2 NEGATIVE NEGATIVE Final    Comment: (NOTE) If result is NEGATIVE SARS-CoV-2 target nucleic acids are NOT DETECTED. The SARS-CoV-2 RNA is generally detectable in upper and lower  respiratory specimens  during the acute phase of infection. The lowest  concentration of SARS-CoV-2 viral copies this assay can detect is 250  copies / mL. A negative result does not preclude SARS-CoV-2 infection  and should not be used as the sole basis for treatment or other  patient management decisions.  A negative result may occur with  improper specimen collection / handling, submission of specimen other  than nasopharyngeal swab, presence of viral mutation(s) within the  areas targeted by this assay, and inadequate number of viral copies  (<250 copies / mL). A negative result must be combined with clinical  observations, patient history, and epidemiological information. If result is POSITIVE SARS-CoV-2 target nucleic acids are DETECTED. The SARS-CoV-2 RNA is generally detectable in upper and lower  respiratory specimens dur ing the acute phase of infection.   Positive  results are indicative of active infection with SARS-CoV-2.  Clinical  correlation with patient history and other diagnostic information is  necessary to determine patient infection status.  Positive results do  not rule out bacterial infection or co-infection with other viruses. If result is PRESUMPTIVE POSTIVE SARS-CoV-2 nucleic acids MAY BE PRESENT.   A presumptive positive result was obtained on the submitted specimen  and confirmed on repeat testing.  While 2019 novel coronavirus  (SARS-CoV-2) nucleic acids may be present in the submitted sample  additional confirmatory testing may be necessary for epidemiological  and / or clinical management purposes  to differentiate between  SARS-CoV-2 and other Sarbecovirus currently known to infect humans.  If clinically indicated additional testing with an alternate test  methodology [phone]) is advised. The SARS-CoV-2 RNA is generally  detectable in upper and lower respiratory sp ecimens during the acute  phase of infection. The expected result is Negative. Fact Sheet for Patients:  BoilerBrush.com.cy Fact Sheet for Healthcare Providers: https://pope.com/ This test is not yet approved or cleared by the [place] FDA and has been authorized for detection and/or diagnosis of SARS-CoV-2 by FDA under an Emergency Use Authorization (EUA).  This EUA will remain in effect (meaning this test can be used) for the duration of the COVID-19 declaration under Section 564(b)(1) of the Act, 21 U.S.C. section 360bbb-3(b)(1), unless the authorization is terminated or revoked sooner. Performed at [facility], [address] [place], [place] [address]   C difficile quick scan w PCR reflex     Status: None   Collection Time: 06/21/18  4:40 AM  Result Value Ref Range Status   C Diff antigen NEGATIVE NEGATIVE Final   C Diff toxin NEGATIVE NEGATIVE Final   C Diff interpretation No C. difficile  detected.  Final    Comment: Performed at [facility], [address], [place], [place] [address]     Radiology Studies: No results found.  Scheduled Meds: . feeding supplement (ENSURE ENLIVE)  237 mL Oral BID BM  . folic acid  1 mg Oral Daily  . LORazepam  0-4 mg Intravenous Q12H  . multivitamin with minerals  1 tablet Oral Daily  . nicotine  21 mg Transdermal Daily  . saccharomyces boulardii  250 mg Oral BID  . thiamine  100 mg Oral Daily   Or  . thiamine  100 mg Intravenous Daily   Continuous Infusions: . sodium chloride Stopped (06/20/18 0522)  . 0.9 % NaCl with KCl 40 mEq / L 125 mL/hr (06/22/18 1610)  . azithromycin 500 mg (06/22/18 0305)  . piperacillin-tazobactam 3.375 g (06/22/18 1321)     LOS: 2 days  Time spent: 30 minutes.   Vassie Loll, MD [facility] Pager [phone]   06/22/2018, 4:52 PM

## 2018-06-23 MED ORDER — DM-GUAIFENESIN ER 30-600 MG PO TB12
1.0000 | ORAL_TABLET | Freq: Two times a day (BID) | ORAL | Status: DC
  Administered 2018-06-23: 05:00:00 1 via ORAL
  Filled 2018-06-23 (×2): qty 1

## 2018-06-23 MED ORDER — ADULT MULTIVITAMIN W/MINERALS CH: 1.0000 | ORAL_TABLET | Freq: Every day | ORAL | 3 refills | Status: DC

## 2018-06-23 MED ORDER — NICOTINE 21 MG/24HR TD PT24: 21.0000 mg | MEDICATED_PATCH | Freq: Every day | TRANSDERMAL | 0 refills | Status: DC

## 2018-06-23 MED ORDER — FOLIC ACID 1 MG PO TABS: 1.0000 mg | ORAL_TABLET | Freq: Every day | ORAL | 1 refills | Status: DC

## 2018-06-23 MED ORDER — SACCHAROMYCES BOULARDII 250 MG PO CAPS: 250.0000 mg | ORAL_CAPSULE | Freq: Two times a day (BID) | ORAL | 0 refills | Status: DC

## 2018-06-23 MED ORDER — BENZONATATE 100 MG PO CAPS: 100.0000 mg | ORAL_CAPSULE | Freq: Three times a day (TID) | ORAL | Status: DC | PRN

## 2018-06-23 MED ORDER — AMOXICILLIN-POT CLAVULANATE 875-125 MG PO TABS: 1.0000 | ORAL_TABLET | Freq: Two times a day (BID) | ORAL | 0 refills | Status: AC

## 2018-06-23 MED ORDER — THIAMINE HCL 100 MG PO TABS: 100.0000 mg | ORAL_TABLET | Freq: Every day | ORAL | 1 refills | Status: DC

## 2018-06-23 MED ORDER — DM-GUAIFENESIN ER 30-600 MG PO TB12: 1.0000 | ORAL_TABLET | Freq: Two times a day (BID) | ORAL | 0 refills | Status: DC

## 2018-06-23 NOTE — Discharge Summary (Signed)
Physician Discharge Summary  Carl HabermannGary W Evans VWU:[id number] DOB: August 12, 1970 DOA: 06/20/2018  PCP: Patient, No Pcp Per  Admit date: 06/20/2018 Discharge date: 06/23/2018  Time spent: 35 minutes  Recommendations for Outpatient Follow-up:  1. Repeat CBC to follow-up hemoglobin and WBCs trend 2. -Repeat basic metabolic panel to follow electrolytes and renal function 3. Repeat magnesium and phosphorus level to assure electrolytes stability 4. Continue assisting with alcohol and tobacco quitting process.   Discharge Diagnoses:  Principal Problem:   CAP (community acquired pneumonia) Active Problems:   Hypokalemia   Hyponatremia   Anemia   Tobacco use   Alcohol abuse   Discharge Condition: Stable and improved.  Patient discharged home with instruction to follow-up with pulmonologist as an outpatient.  Diet recommendation: Regular diet  Filed Weights   06/20/18 0129 06/20/18 0615  Weight: 49.9 kg 42.3 kg    History of present illness:  48 y.o.malewith medical history significant ofalcohol, tobacco abuse who is coming to the emergency department with complaints of progressively worse chest pain associated with dyspnea for at least 2 weeks. He has been having cough, which is occasionally productive of yellowish sputum. He denies fever, rhinorrhea, sore throat, night sweats, wheezing or hemoptysis. He denies sick contacts or travel history. The chest pain is pleuritic without dizziness, palpitations, diaphoresis, orthopnea or pitting edema of the lower extremities. The patient also states that he has lost about 15 pounds of weight in the last month and 1/2 to 2 months. He also complains that in the last 2 to 3 days he has been having episodes of persistent singultus for which he has to self-induced vomiting to make it better. He states that he has to induce vomiting 3 times over the last 2 days. He denies diarrhea, constipation, melena or hematochezia. He denies dysuria, frequency or  hematuria. He denies polyuria, polydipsia, polyphagia or blurred vision.  Hospital Course:  1-sepsis secondary to pneumonia: CAP (community acquired pneumonia) versus aspiration. -Follow final cultures results and sensitivity -No shortness of breath, no wheezing, no fever and no distress at discharge. -Patient tolerating diet without any problems -Cavitary lesion appreciated on imaging studies -Case discussed with pulmonologist who felt that his presentation was most likely secondary to aspiration pneumonia; patient has been discharged on Augmentin twice a day for 2 weeks as recommended. -Outpatient follow-up at pulmonologist office for repeat imaging studies and further work-up as needed at that time. -Patient with risk factors for malignancy given tobacco abuse. -Sepsis features essentially resolved.  2-hyponatremia -In the setting of dehydration and chronic alcohol abuse -Improved with fluid resuscitation -Alcohol cessation has been provided -Repeat basic metabolic panel at follow-up visit to reassess electrolytes trend -Sodium level within normal limits at discharge.  3-Hypokalemia -In the setting of poor oral intake and GI losses -Status post repletion -Patient instructed to avoid alcohol use, to maintain adequate nutrition and oral intake. -Multivitamins to assist with electrolytes supplementation provided at discharge -Repeat basic metabolic panel at follow-up visit to reassess electrolytes trend.  4-tobacco abuse -Cessation counseling has been provided -Nicotine patch has been ordered at discharge..  5-alcohol abuse -monitored under CIWA protocol while inpatient -Continue folic acid, multivitamin and thiamine supplementation. -B12 level within normal limits. -No withdrawal symptoms on exam. -Cessation counseling provided.  6-Severe protein calorie malnutrition -Dietitian service consulted  -Patient discharge with instructions to use Ensure or boost 2-3 times  daily between meals.   -Body mass index is 14.18 kg/m.  Procedures:  See below for x-ray reports.  Consultations:  Pulmonology  service (Dr. Juanetta Gosling).  Discharge Exam: Vitals:   06/22/18 2100 06/23/18 0604  BP: 117/85 (!) 143/110  Pulse: 90 99  Resp: 20 20  Temp: 98.3 F (36.8 C) 97.9 F (36.6 C)  SpO2: 100% 100%    General: Alert, awake and oriented x3; has remained afebrile for 48 hours, no nausea no vomiting, with improved appetite.  Chronically ill and underweight in appearance.  Poor dentition on exam. Cardiovascular: RRR.  No murmurs, no rubs, no gallops. Respiratory: No wheezing, positive scattered rhonchi right, no using accessory muscles.  Good O2 sat on room air. Abdomen: Soft, nontender, nondistended, positive bowel sounds Extremities: No cyanosis, no clubbing.  Discharge Instructions   Discharge Instructions    Discharge instructions   Complete by:  As directed    Take medications as prescribed Follow-up with pulmonologist as instructed Stop smoking Stop alcohol consumption Use Ensure or boost as feeding supplements 2-3 times a day between meals. Establish care with primary care physician as recommended.     Allergies as of 06/23/2018   No Known Allergies     Medication List    STOP taking these medications   naproxen 500 MG tablet Commonly known as:  NAPROSYN     TAKE these medications   acetaminophen 500 MG tablet Commonly known as:  TYLENOL Take 500 mg by mouth daily as needed for mild pain.   amoxicillin-clavulanate 875-125 MG tablet Commonly known as:  Augmentin Take 1 tablet by mouth 2 (two) times daily for 14 days.   dextromethorphan-guaiFENesin 30-600 MG 12hr tablet Commonly known as:  MUCINEX DM Take 1 tablet by mouth 2 (two) times daily.   folic acid 1 MG tablet Commonly known as:  FOLVITE Take 1 tablet (1 mg total) by mouth daily. Start taking on:  Jun 24, 2018   multivitamin with minerals Tabs tablet Take 1 tablet by  mouth daily. Start taking on:  Jun 24, 2018   nicotine 21 mg/24hr patch Commonly known as:  NICODERM CQ - dosed in mg/24 hours Place 1 patch (21 mg total) onto the skin daily. Start taking on:  Jun 24, 2018   saccharomyces boulardii 250 MG capsule Commonly known as:  FLORASTOR Take 1 capsule (250 mg total) by mouth 2 (two) times daily.   thiamine 100 MG tablet Take 1 tablet (100 mg total) by mouth daily. Start taking on:  Jun 24, 2018      No Known Allergies Follow-up Information    Kari Baars, MD. Schedule an appointment as soon as possible for a visit in 10 weeks.   Specialty:  Pulmonary Disease Why:  contact office for appointment details. Contact information: [address] [place] [address] [phone]           The results of significant diagnostics from this hospitalization (including imaging, microbiology, ancillary and laboratory) are listed below for reference.    Significant Diagnostic Studies: Dg Chest 2 View  Result Date: 06/20/2018 CLINICAL DATA:  Left-sided chest pain. Cough and shortness of breath. EXAM: CHEST - 2 VIEW COMPARISON:  Radiograph 01/25/2014 FINDINGS: Ill-defined left perihilar density with central lucency, suspicious for necrotic process. Right lung is clear. Heart is normal in size. No pulmonary edema, pleural effusion, or pneumothorax. No acute osseous abnormalities are seen. IMPRESSION: Ill-defined left perihilar density with central lucency, suspicious for cavitary process. Recommend chest CT, preferably with contrast for further characterization. Differential considerations based on radiograph include pneumonia or mass, possibly necrotic. Electronically Signed   By: Ivette Loyal.D.  On: 06/20/2018 02:51   Ct Angio Chest Pe W And/or Wo Contrast  Result Date: 06/20/2018 CLINICAL DATA:  Chest pain with elevated D-dimer. EXAM: CT ANGIOGRAPHY CHEST WITH CONTRAST TECHNIQUE: Multidetector CT imaging of the chest was performed  using the standard protocol during bolus administration of intravenous contrast. Multiplanar CT image reconstructions and MIPs were obtained to evaluate the vascular anatomy. CONTRAST:  OMNIPAQUE IOHEXOL 350 MG/ML SOLN COMPARISON:  CTA chest 06/22/2009 FINDINGS: Cardiovascular: --Pulmonary arteries: Contrast injection is sufficient to demonstrate satisfactory opacification of the pulmonary arteries to the segmental level, with attenuation of 477 HU at the main pulmonary artery. There is no pulmonary embolus. The main pulmonary artery is within normal limits for size. --Aorta: Limited opacification of the aorta due to bolus timing optimization for the pulmonary arteries. Conventional 3 vessel aortic branching pattern. The aortic course and caliber are normal. There is no aortic atherosclerosis. --Heart: Normal size. No pericardial effusion. Mediastinum/Nodes: No mediastinal, hilar or axillary lymphadenopathy. The visualized thyroid and thoracic esophageal course are unremarkable. Lungs/Pleura: There is a large area of cavitation within the left lower lobe that measures 5.4 x 5.6 x 7.2 cm. The lungs are otherwise clear. No pleural effusion or pneumothorax. Upper Abdomen: Contrast bolus timing is not optimized for evaluation of the abdominal organs. Within this limitation, the visualized organs of the upper abdomen are normal. Musculoskeletal: No chest wall abnormality. No acute or significant osseous findings. Review of the MIP images confirms the above findings. IMPRESSION: 1. No pulmonary embolus. 2. Large cavitary lesion of the left lower lobe, most consistent with necrotizing infection. A necrotic mass is a secondary consideration. Electronically Signed   By: Deatra Robinson M.D.   On: 06/20/2018 03:32   Microbiology: Recent Results (from the past 240 hour(s))  SARS Coronavirus 2 (CEPHEID- Performed in [facility] hospital lab), Hosp Order     Status: None   Collection Time: 06/20/18  4:00 AM  Result  Value Ref Range Status   SARS Coronavirus 2 NEGATIVE NEGATIVE Final    Comment: (NOTE) If result is NEGATIVE SARS-CoV-2 target nucleic acids are NOT DETECTED. The SARS-CoV-2 RNA is generally detectable in upper and lower  respiratory specimens during the acute phase of infection. The lowest  concentration of SARS-CoV-2 viral copies this assay can detect is 250  copies / mL. A negative result does not preclude SARS-CoV-2 infection  and should not be used as the sole basis for treatment or other  patient management decisions.  A negative result may occur with  improper specimen collection / handling, submission of specimen other  than nasopharyngeal swab, presence of viral mutation(s) within the  areas targeted by this assay, and inadequate number of viral copies  (<250 copies / mL). A negative result must be combined with clinical  observations, patient history, and epidemiological information. If result is POSITIVE SARS-CoV-2 target nucleic acids are DETECTED. The SARS-CoV-2 RNA is generally detectable in upper and lower  respiratory specimens dur ing the acute phase of infection.  Positive  results are indicative of active infection with SARS-CoV-2.  Clinical  correlation with patient history and other diagnostic information is  necessary to determine patient infection status.  Positive results do  not rule out bacterial infection or co-infection with other viruses. If result is PRESUMPTIVE POSTIVE SARS-CoV-2 nucleic acids MAY BE PRESENT.   A presumptive positive result was obtained on the submitted specimen  and confirmed on repeat testing.  While 2019 novel coronavirus  (SARS-CoV-2) nucleic acids may be present  in the submitted sample  additional confirmatory testing may be necessary for epidemiological  and / or clinical management purposes  to differentiate between  SARS-CoV-2 and other Sarbecovirus currently known to infect humans.  If clinically indicated additional testing  with an alternate test  methodology [phone]) is advised. The SARS-CoV-2 RNA is generally  detectable in upper and lower respiratory sp ecimens during the acute  phase of infection. The expected result is Negative. Fact Sheet for Patients:  BoilerBrush.com.cy Fact Sheet for Healthcare Providers: https://pope.com/ This test is not yet approved or cleared by the [place] FDA and has been authorized for detection and/or diagnosis of SARS-CoV-2 by FDA under an Emergency Use Authorization (EUA).  This EUA will remain in effect (meaning this test can be used) for the duration of the COVID-19 declaration under Section 564(b)(1) of the Act, 21 U.S.C. section 360bbb-3(b)(1), unless the authorization is terminated or revoked sooner. Performed at [facility], [address] [place], [place] [address]   C difficile quick scan w PCR reflex     Status: None   Collection Time: 06/21/18  4:40 AM  Result Value Ref Range Status   C Diff antigen NEGATIVE NEGATIVE Final   C Diff toxin NEGATIVE NEGATIVE Final   C Diff interpretation No C. difficile detected.  Final    Comment: Performed at [facility], [address], [place], [place] [address]     Labs: Basic Metabolic Panel: Recent Labs  Lab 06/20/18 0152 06/20/18 1114 06/21/18 0610 06/22/18 0550  NA 124* 128* 133* 135  K 3.2* 3.7 3.4* 4.3  CL 83* 91* 98 104  CO2 24 25 26  21*  GLUCOSE 130* 103* 96 102*  BUN 18 14 9  5*  CREATININE 1.07 0.83 0.83 0.86  CALCIUM 8.5* 8.4* 7.9* 8.0*  MG 1.7  --   --  1.8  PHOS 3.0  --   --   --    Liver Function Tests: Recent Labs  Lab 06/20/18 0152  AST 14*  ALT 14  ALKPHOS 76  BILITOT 0.9  PROT 7.4  ALBUMIN 2.5*   CBC: Recent Labs  Lab 06/20/18 0152 06/22/18 0550  WBC 27.1* 13.8*  HGB 11.7* 9.9*  HCT 32.8* 29.2*  MCV 87.2 91.5  PLT 512* 479*   Cardiac Enzymes: Recent Labs  Lab 06/20/18 0152  TROPONINI <0.03   CBG: Recent Labs   Lab 06/20/18 0157  GLUCAP 117*    Signed:  Vassie Loll MD.  [facility] 06/23/2018, 11:42 AM

## 2018-06-23 NOTE — Consult Note (Signed)
Consult requested by: [facility] hospitalist, Dr. Gwenlyn PerkingMadera Consult requested for: Abnormal chest CT  HPI: This is a 48 year old who is known to have alcohol and tobacco abuse and who came to the emergency department with complaints of chest pain and shortness of breath for about 2 weeks.  He tells me that he was only sick for about 3 days but his original history that was that he was sick for about 2 weeks.  He had pleuritic chest pain.  He did deny fever chills night sweats wheezing hemoptysis.  He says at baseline he does not have a lot of breathing trouble.  He is unaware of any family history of lung disease.  He has not had any exposures that he is aware of.  He has lost weight in the last 1-1/2 to 2 months he originally said 15 pounds on his history and physical and he now tells me about 5 pounds.  He is not had any other symptoms.  He has been coughing up a little bit of sputum.  He is not had any diarrhea constipation headaches.  He has been self inducing vomiting prior to admission but he says he is not nauseated now.  He has significant history of alcohol abuse although he denies any episodes of unconsciousness his CT is consistent with pneumonia likely from aspiration with cavity.  Past Medical History:  Diagnosis Date  . Alcohol abuse 06/20/2018  . Tobacco use 06/20/2018     Family History  Problem Relation Age of Onset  . Thyroid disease Mother   . Kidney disease Father   . Thyroid disease Sister      Social History   Socioeconomic History  . Marital status: Single    Spouse name: Not on file  . Number of children: Not on file  . Years of education: Not on file  . Highest education level: Not on file  Occupational History  . Not on file  Social Needs  . Financial resource strain: Not on file  . Food insecurity:    Worry: Not on file    Inability: Not on file  . Transportation needs:    Medical: Not on file    Non-medical: Not on file  Tobacco Use  . Smoking status: Current  Every Day Smoker  . Smokeless tobacco: Never Used  Substance and Sexual Activity  . Alcohol use: Yes  . Drug use: Yes    Types: Marijuana  . Sexual activity: Not on file  Lifestyle  . Physical activity:    Days per week: Not on file    Minutes per session: Not on file  . Stress: Not on file  Relationships  . Social connections:    Talks on phone: Not on file    Gets together: Not on file    Attends religious service: Not on file    Active member of club or organization: Not on file    Attends meetings of clubs or organizations: Not on file    Relationship status: Not on file  Other Topics Concern  . Not on file  Social History Narrative  . Not on file     ROS: Except as mentioned 10 point review of systems is negative    Objective: Vital signs in last 24 hours: Temp:  [97.8 F (36.6 C)-98.8 F (37.1 C)] 97.9 F (36.6 C) (05/18 0604) Pulse Rate:  [83-106] 99 (05/18 0604) Resp:  [17-20] 20 (05/18 0604) BP: (105-143)/(72-110) 143/110 (05/18 0604) SpO2:  [98 %-100 %] 100 % (  05/18 0604) Weight change:  Last BM Date: 06/22/18  Intake/Output from previous day: 05/17 0701 - 05/18 0700 In: 3220.5 [P.O.:1160; I.V.:1919.3; IV Piggyback:141.1] Out: -   PHYSICAL EXAM Constitutional: He is thin.  Eyes: Pupils react EOMI.  Ears nose mouth and throat: His mucous membranes are dry.  Hearing is grossly normal.  Cardiovascular: His heart is regular with normal heart sounds.  Respiratory: Respiratory effort is normal.  He has somewhat prolonged expiratory phase.  Gastrointestinal: His abdomen soft with no masses.  Musculoskeletal: Grossly normal strength.  Neurological: No focal abnormalities.  Psychiatric: Normal mood and affect  Lab Results: Basic Metabolic Panel: Recent Labs    06/21/18 0610 06/22/18 0550  NA 133* 135  K 3.4* 4.3  CL 98 104  CO2 26 21*  GLUCOSE 96 102*  BUN 9 5*  CREATININE 0.83 0.86  CALCIUM 7.9* 8.0*  MG  --  1.8   Liver Function Tests: No results  for input(s): AST, ALT, ALKPHOS, BILITOT, PROT, ALBUMIN in the last 72 hours. No results for input(s): LIPASE, AMYLASE in the last 72 hours. No results for input(s): AMMONIA in the last 72 hours. CBC: Recent Labs    06/22/18 0550  WBC 13.8*  HGB 9.9*  HCT 29.2*  MCV 91.5  PLT 479*   Cardiac Enzymes: No results for input(s): CKTOTAL, CKMB, CKMBINDEX, TROPONINI in the last 72 hours. BNP: No results for input(s): PROBNP in the last 72 hours. D-Dimer: No results for input(s): DDIMER in the last 72 hours. CBG: No results for input(s): GLUCAP in the last 72 hours. Hemoglobin A1C: No results for input(s): HGBA1C in the last 72 hours. Fasting Lipid Panel: No results for input(s): CHOL, HDL, LDLCALC, TRIG, CHOLHDL, LDLDIRECT in the last 72 hours. Thyroid Function Tests: No results for input(s): TSH, T4TOTAL, FREET4, T3FREE, THYROIDAB in the last 72 hours. Anemia Panel: Recent Labs    06/22/18 0550  VITAMINB12 315   Coagulation: No results for input(s): LABPROT, INR in the last 72 hours. Urine Drug Screen: Drugs of Abuse     Component Value Date/Time   LABOPIA NONE DETECTED 06/29/2009 0350   COCAINSCRNUR NONE DETECTED 06/29/2009 0350   LABBENZ POSITIVE (A) 06/29/2009 0350   AMPHETMU NONE DETECTED 06/29/2009 0350   THCU POSITIVE (A) 06/29/2009 0350   LABBARB  06/29/2009 0350    NONE DETECTED        DRUG SCREEN FOR MEDICAL PURPOSES ONLY.  IF CONFIRMATION IS NEEDED FOR ANY PURPOSE, NOTIFY LAB WITHIN 5 DAYS.        LOWEST DETECTABLE LIMITS FOR URINE DRUG SCREEN Drug Class       Cutoff (ng/mL) Amphetamine      1000 Barbiturate      200 Benzodiazepine   200 Tricyclics       300 Opiates          300 Cocaine          300 THC              50    Alcohol Level: No results for input(s): ETH in the last 72 hours. Urinalysis: No results for input(s): COLORURINE, LABSPEC, PHURINE, GLUCOSEU, HGBUR, BILIRUBINUR, KETONESUR, PROTEINUR, UROBILINOGEN, NITRITE, LEUKOCYTESUR in the  last 72 hours.  Invalid input(s): APPERANCEUR Misc. Labs:   ABGS: No results for input(s): PHART, PO2ART, TCO2, HCO3 in the last 72 hours.  Invalid input(s): PCO2   MICROBIOLOGY: Recent Results (from the past 240 hour(s))  SARS Coronavirus 2 (CEPHEID- Performed in [facility] hospital lab),  Hosp Order     Status: None   Collection Time: 06/20/18  4:00 AM  Result Value Ref Range Status   SARS Coronavirus 2 NEGATIVE NEGATIVE Final    Comment: (NOTE) If result is NEGATIVE SARS-CoV-2 target nucleic acids are NOT DETECTED. The SARS-CoV-2 RNA is generally detectable in upper and lower  respiratory specimens during the acute phase of infection. The lowest  concentration of SARS-CoV-2 viral copies this assay can detect is 250  copies / mL. A negative result does not preclude SARS-CoV-2 infection  and should not be used as the sole basis for treatment or other  patient management decisions.  A negative result may occur with  improper specimen collection / handling, submission of specimen other  than nasopharyngeal swab, presence of viral mutation(s) within the  areas targeted by this assay, and inadequate number of viral copies  (<250 copies / mL). A negative result must be combined with clinical  observations, patient history, and epidemiological information. If result is POSITIVE SARS-CoV-2 target nucleic acids are DETECTED. The SARS-CoV-2 RNA is generally detectable in upper and lower  respiratory specimens dur ing the acute phase of infection.  Positive  results are indicative of active infection with SARS-CoV-2.  Clinical  correlation with patient history and other diagnostic information is  necessary to determine patient infection status.  Positive results do  not rule out bacterial infection or co-infection with other viruses. If result is PRESUMPTIVE POSTIVE SARS-CoV-2 nucleic acids MAY BE PRESENT.   A presumptive positive result was obtained on the submitted specimen  and  confirmed on repeat testing.  While 2019 novel coronavirus  (SARS-CoV-2) nucleic acids may be present in the submitted sample  additional confirmatory testing may be necessary for epidemiological  and / or clinical management purposes  to differentiate between  SARS-CoV-2 and other Sarbecovirus currently known to infect humans.  If clinically indicated additional testing with an alternate test  methodology [phone]) is advised. The SARS-CoV-2 RNA is generally  detectable in upper and lower respiratory sp ecimens during the acute  phase of infection. The expected result is Negative. Fact Sheet for Patients:  BoilerBrush.com.cy Fact Sheet for Healthcare Providers: https://pope.com/ This test is not yet approved or cleared by the [place] FDA and has been authorized for detection and/or diagnosis of SARS-CoV-2 by FDA under an Emergency Use Authorization (EUA).  This EUA will remain in effect (meaning this test can be used) for the duration of the COVID-19 declaration under Section 564(b)(1) of the Act, 21 U.S.C. section 360bbb-3(b)(1), unless the authorization is terminated or revoked sooner. Performed at [facility], [address], [place], [place] [address]   C difficile quick scan w PCR reflex     Status: None   Collection Time: 06/21/18  4:40 AM  Result Value Ref Range Status   C Diff antigen NEGATIVE NEGATIVE Final   C Diff toxin NEGATIVE NEGATIVE Final   C Diff interpretation No C. difficile detected.  Final    Comment: Performed at [facility], [address], [place], [place] [address]    Studies/Results: No results found.  Medications:  Prior to Admission:  Medications Prior to Admission  Medication Sig Dispense Refill Last Dose  . acetaminophen (TYLENOL) 500 MG tablet Take 500 mg by mouth daily as needed for mild pain.   Past Month at Unknown time  . naproxen (NAPROSYN) 500 MG tablet Take 1 tablet (500 mg total) by  mouth 2 (two) times daily. (Patient not taking: Reported on 06/20/2018) 30  tablet 0 Not Taking at Unknown time   Scheduled: . dextromethorphan-guaiFENesin  1 tablet Oral BID  . feeding supplement (ENSURE ENLIVE)  237 mL Oral BID BM  . folic acid  1 mg Oral Daily  . LORazepam  0-4 mg Intravenous Q12H  . multivitamin with minerals  1 tablet Oral Daily  . nicotine  21 mg Transdermal Daily  . saccharomyces boulardii  250 mg Oral BID  . thiamine  100 mg Oral Daily   Or  . thiamine  100 mg Intravenous Daily   Continuous: . sodium chloride Stopped (06/20/18 0522)  . 0.9 % NaCl with KCl 40 mEq / L 50 mL/hr (06/23/18 0355)  . azithromycin 250 mL/hr at 06/23/18 0400  . piperacillin-tazobactam 3.375 g (06/23/18 0508)   ZOX:WRUEAV chloride, alum & mag hydroxide-simeth, benzonatate  Assesment: He has community-acquired pneumonia in the left lower lobe and has a cavity.  I think this is unlikely to be anything like tuberculosis and more likely to be aspiration pneumonia related to his alcoholism.  He has significant smoking history with COPD which is being treated  He has significant history of alcohol abuse and has been on withdrawal protocol  He has lost weight which would suggest that this is possibly been going on longer than he is aware of but also raises concern of malignancy considering his smoking history.  CT more suggestive of pneumonia then malignancy but it is obviously not ruled out Principal Problem:   CAP (community acquired pneumonia) Active Problems:   Hypokalemia   Hyponatremia   Anemia   Tobacco use   Alcohol abuse    Plan: There is apparently some potential for him to be discharged home today.  If he is discharged I would send him home on Augmentin twice daily for 2 weeks.  He will need follow-up CT because the chest x-ray which I have also personally reviewed does not show the area as well.  I discussed all that with the patient.  Thanks for allowing me to see him with  you    LOS: 3 days   Fredirick Maudlin 06/23/2018, 8:10 AM

## 2018-06-23 NOTE — Progress Notes (Signed)
Nsg Discharge Note  Admit Date:  06/20/2018 Discharge date: 06/23/2018   Bridgette Habermann to be D/C'd Home per MD order.  AVS completed.  Copy for chart, and copy for patient signed, and dated. Patient/caregiver able to verbalize understanding.  Discharge Medication: Allergies as of 06/23/2018   No Known Allergies     Medication List    STOP taking these medications   naproxen 500 MG tablet Commonly known as:  NAPROSYN     TAKE these medications   acetaminophen 500 MG tablet Commonly known as:  TYLENOL Take 500 mg by mouth daily as needed for mild pain.   amoxicillin-clavulanate 875-125 MG tablet Commonly known as:  Augmentin Take 1 tablet by mouth 2 (two) times daily for 14 days.   dextromethorphan-guaiFENesin 30-600 MG 12hr tablet Commonly known as:  MUCINEX DM Take 1 tablet by mouth 2 (two) times daily.   folic acid 1 MG tablet Commonly known as:  FOLVITE Take 1 tablet (1 mg total) by mouth daily. Start taking on:  Jun 24, 2018   multivitamin with minerals Tabs tablet Take 1 tablet by mouth daily. Start taking on:  Jun 24, 2018   nicotine 21 mg/24hr patch Commonly known as:  NICODERM CQ - dosed in mg/24 hours Place 1 patch (21 mg total) onto the skin daily. Start taking on:  Jun 24, 2018   saccharomyces boulardii 250 MG capsule Commonly known as:  FLORASTOR Take 1 capsule (250 mg total) by mouth 2 (two) times daily.   thiamine 100 MG tablet Take 1 tablet (100 mg total) by mouth daily. Start taking on:  Jun 24, 2018       Discharge Assessment: Vitals:   06/22/18 2100 06/23/18 0604  BP: 117/85 (!) 143/110  Pulse: 90 99  Resp: 20 20  Temp: 98.3 F (36.8 C) 97.9 F (36.6 C)  SpO2: 100% 100%   Skin clean, dry and intact without evidence of skin break down, no evidence of skin tears noted. IV catheter discontinued intact. Site without signs and symptoms of complications - no redness or edema noted at insertion site, patient denies c/o pain - only slight  tenderness at site.  Dressing with slight pressure applied.  D/c Instructions-Education: Discharge instructions given to patient/family with verbalized understanding. D/c education completed with patient/family including follow up instructions, medication list, d/c activities limitations if indicated, with other d/c instructions as indicated by MD - patient able to verbalize understanding, all questions fully answered. Patient instructed to return to ED, call 911, or call MD for any changes in condition.  Patient escorted via WC, and D/C home via private auto.  Andria Rhein, RN 06/23/2018 11:56 AM

## 2018-06-23 NOTE — TOC Initial Note (Signed)
Transition of Care [facility]) - Initial/Assessment Note    Patient Details  Name: Carl Evans MRN: [id number] Date of Birth: May 31, 1970  [facility]) CM/SW Contact:    Malcolm Metro, RN Phone Number: 06/23/2018, 9:29 AM  Clinical Narrative:   Admitted with pneumonia. Pt from home, lives with family. Has medicaid, no PCP. Pt given list of PCP's accepting pt's. Pt understands offices require pt to complete paperwork to establish care and will not make appointment until that is completed. Pt plans to have his family member assist him. Pt's medicaid will cover cost of meds with $3 co-pay. No transportation needs. No other needs noted. CM may be consulted if needs arise.                  Expected Discharge Plan: Home/Self Care    Expected Discharge Plan and Services Expected Discharge Plan: Home/Self Care       Living arrangements for the past 2 months: Single Family Home Expected Discharge Date: 06/21/18                     Prior Living Arrangements/Services Living arrangements for the past 2 months: Single Family Home Lives with:: Siblings Patient language and need for interpreter reviewed:: Yes Do you feel safe going back to the place where you live?: Yes      Need for Family Participation in Patient Care: Yes (Comment) Care giver support system in place?: Yes (comment)   Criminal Activity/Legal Involvement Pertinent to Current Situation/Hospitalization: No - Comment as needed  Activities of Daily Living Home Assistive Devices/Equipment: None ADL Screening (condition at time of admission) Patient's cognitive ability adequate to safely complete daily activities?: Yes Is the patient deaf or have difficulty hearing?: No Does the patient have difficulty seeing, even when wearing glasses/contacts?: No Does the patient have difficulty concentrating, remembering, or making decisions?: No Patient able to express need for assistance with ADLs?: No Does the  patient have difficulty dressing or bathing?: No Independently performs ADLs?: Yes (appropriate for developmental age) Does the patient have difficulty walking or climbing stairs?: No Weakness of Legs: Both Weakness of Arms/Hands: Both  Permission Sought/Granted Permission sought to share information with : (n/a) Permission granted to share information with : (n/a)              Emotional Assessment Appearance:: Appears older than stated age Attitude/Demeanor/Rapport: Engaged Affect (typically observed): Calm, Pleasant Orientation: : Oriented to Self, Oriented to Place, Oriented to  Time, Oriented to Situation      Admission diagnosis:  Nonspecific chest pain [R07.9] Patient Active Problem List   Diagnosis Date Noted  . CAP (community acquired pneumonia) 06/20/2018  . Hypokalemia 06/20/2018  . Hyponatremia 06/20/2018  . Anemia 06/20/2018  . Tobacco use 06/20/2018  . Alcohol abuse 06/20/2018   PCP:  Patient, No Pcp Per Pharmacy:   [facility] - [place] [place] - [address] [place] [address] Phone: [phone] Fax: [phone]    Readmission Risk Interventions Readmission Risk Prevention Plan 06/23/2018  Post Dischage Appt Not Complete  Appt Comments pt given list of PCP's accepting new pt's to f/u with after DC  Medication Screening Complete  Transportation Screening Complete  Some recent data might be hidden

## 2019-08-30 ENCOUNTER — Other Ambulatory Visit: Payer: Self-pay

## 2019-08-30 ENCOUNTER — Encounter (HOSPITAL_COMMUNITY): Payer: Self-pay

## 2019-08-30 ENCOUNTER — Emergency Department (HOSPITAL_COMMUNITY)
Admission: EM | Admit: 2019-08-30 | Discharge: 2019-08-31 | Disposition: A | Discharge status: Home / Self Care | Payer: Medicaid Other | Attending: Emergency Medicine | Admitting: Emergency Medicine

## 2019-08-30 DIAGNOSIS — N179 Acute kidney failure, unspecified: Secondary | ICD-10-CM | POA: Insufficient documentation

## 2019-08-30 DIAGNOSIS — F1010 Alcohol abuse, uncomplicated: Secondary | ICD-10-CM

## 2019-08-30 DIAGNOSIS — F17200 Nicotine dependence, unspecified, uncomplicated: Secondary | ICD-10-CM | POA: Diagnosis not present

## 2019-08-30 DIAGNOSIS — U071 COVID-19: Secondary | ICD-10-CM | POA: Insufficient documentation

## 2019-08-30 DIAGNOSIS — R0602 Shortness of breath: Secondary | ICD-10-CM | POA: Diagnosis present

## 2019-08-30 DIAGNOSIS — E860 Dehydration: Secondary | ICD-10-CM | POA: Insufficient documentation

## 2019-08-30 NOTE — ED Triage Notes (Addendum)
EMS from home. SOB x2 days. Dizzy. +n/v/d.+othrostatic with ems.  18l fa. ivf A&ox4

## 2019-08-30 NOTE — ED Notes (Signed)
Pt stood up for ortho static v/s without reporting any dizziness or SOB

## 2019-08-30 NOTE — ED Notes (Signed)
Ambulatory to restroom

## 2019-08-31 ENCOUNTER — Emergency Department (HOSPITAL_COMMUNITY): Payer: Medicaid Other

## 2019-08-31 LAB — CBC WITH DIFFERENTIAL/PLATELET
Abs Immature Granulocytes: 0.02 10*3/uL (ref 0.00–0.07)
Basophils Absolute: 0 10*3/uL (ref 0.0–0.1)
Basophils Relative: 1 %
Eosinophils Absolute: 0 10*3/uL (ref 0.0–0.5)
Eosinophils Relative: 1 %
HCT: 44.7 % (ref 39.0–52.0)
Hemoglobin: 14.9 g/dL (ref 13.0–17.0)
Immature Granulocytes: 1 %
Lymphocytes Relative: 17 %
Lymphs Abs: 0.7 10*3/uL (ref 0.7–4.0)
MCH: 32.5 pg (ref 26.0–34.0)
MCHC: 33.3 g/dL (ref 30.0–36.0)
MCV: 97.4 fL (ref 80.0–100.0)
Monocytes Absolute: 0.9 10*3/uL (ref 0.1–1.0)
Monocytes Relative: 22 %
Neutro Abs: 2.4 10*3/uL (ref 1.7–7.7)
Neutrophils Relative %: 58 %
Platelets: 161 10*3/uL (ref 150–400)
RBC: 4.59 MIL/uL (ref 4.22–5.81)
RDW: 13 % (ref 11.5–15.5)
WBC: 4.1 10*3/uL (ref 4.0–10.5)
nRBC: 0 % (ref 0.0–0.2)

## 2019-08-31 LAB — BASIC METABOLIC PANEL
Anion gap: 9 (ref 5–15)
BUN: 14 mg/dL (ref 6–20)
CO2: 22 mmol/L (ref 22–32)
Calcium: 7.9 mg/dL — ABNORMAL LOW (ref 8.9–10.3)
Chloride: 102 mmol/L (ref 98–111)
Creatinine, Ser: 2 mg/dL — ABNORMAL HIGH (ref 0.61–1.24)
GFR calc Af Amer: 44 mL/min — ABNORMAL LOW (ref >60)
GFR calc non Af Amer: 38 mL/min — ABNORMAL LOW (ref >60)
Glucose, Bld: 98 mg/dL (ref 70–99)
Potassium: 3.6 mmol/L (ref 3.5–5.1)
Sodium: 133 mmol/L — ABNORMAL LOW (ref 135–145)

## 2019-08-31 LAB — COMPREHENSIVE METABOLIC PANEL
ALT: 64 U/L — ABNORMAL HIGH (ref 0–44)
AST: 87 U/L — ABNORMAL HIGH (ref 15–41)
Albumin: 3.9 g/dL (ref 3.5–5.0)
Alkaline Phosphatase: 83 U/L (ref 38–126)
Anion gap: 13 (ref 5–15)
BUN: 16 mg/dL (ref 6–20)
CO2: 18 mmol/L — ABNORMAL LOW (ref 22–32)
Calcium: 8.2 mg/dL — ABNORMAL LOW (ref 8.9–10.3)
Chloride: 100 mmol/L (ref 98–111)
Creatinine, Ser: 2.24 mg/dL — ABNORMAL HIGH (ref 0.61–1.24)
GFR calc Af Amer: 38 mL/min — ABNORMAL LOW (ref >60)
GFR calc non Af Amer: 33 mL/min — ABNORMAL LOW (ref >60)
Glucose, Bld: 97 mg/dL (ref 70–99)
Potassium: 4 mmol/L (ref 3.5–5.1)
Sodium: 131 mmol/L — ABNORMAL LOW (ref 135–145)
Total Bilirubin: 0.5 mg/dL (ref 0.3–1.2)
Total Protein: 7.9 g/dL (ref 6.5–8.1)

## 2019-08-31 LAB — ETHANOL: Alcohol, Ethyl (B): <10 mg/dL (ref <10)

## 2019-08-31 LAB — RAPID URINE DRUG SCREEN, HOSP PERFORMED
Amphetamines: NOT DETECTED
Barbiturates: NOT DETECTED
Benzodiazepines: NOT DETECTED
Cocaine: NOT DETECTED
Opiates: NOT DETECTED
Tetrahydrocannabinol: POSITIVE — AB

## 2019-08-31 LAB — SARS CORONAVIRUS 2 BY RT PCR (HOSPITAL ORDER, PERFORMED IN ~~LOC~~ HOSPITAL LAB): SARS Coronavirus 2: POSITIVE — AB

## 2019-08-31 MED ORDER — LACTATED RINGERS IV BOLUS
1000.0000 mL | Freq: Once | INTRAVENOUS | Status: AC
  Administered 2019-08-31: 1000 mL via INTRAVENOUS

## 2019-08-31 NOTE — ED Provider Notes (Signed)
[facility] EMERGENCY DEPARTMENT Provider Note   CSN: [id number] Arrival date & time: 08/30/19  2306     History Chief Complaint  Patient presents with  . Shortness of Breath    Carl Evans is a 49 y.o. male.  The history is provided by the patient.  Shortness of Breath Severity:  Moderate Onset quality:  Gradual Duration:  2 days Timing:  Intermittent Progression:  Worsening Chronicity:  New Context: activity   Relieved by:  Rest Worsened by:  Exertion Associated symptoms: cough and vomiting   Associated symptoms: no chest pain and no fever   Risk factors: alcohol use   Patient with history of daily alcohol abuse presents with dizziness, and shortness of breath.  Patient reports for the past week he has had dizziness and lightheadedness.  He reports both nonbloody emesis and nonbloody diarrhea for the past week.  Patient is still drinking alcohol, last drink at 2 PM.  Patient also reports increasing cough and shortness of breath.  He reports over the past 2 days has had increasing shortness of breath walking up steps.      Past Medical History:  Diagnosis Date  . Alcohol abuse 06/20/2018  . Tobacco use 06/20/2018    Patient Active Problem List   Diagnosis Date Noted  . CAP (community acquired pneumonia) 06/20/2018  . Hypokalemia 06/20/2018  . Hyponatremia 06/20/2018  . Anemia 06/20/2018  . Tobacco use 06/20/2018  . Alcohol abuse 06/20/2018    History reviewed. No pertinent surgical history.     Family History  Problem Relation Age of Onset  . Thyroid disease Mother   . Kidney disease Father   . Thyroid disease Sister     Social History   Tobacco Use  . Smoking status: Current Every Day Smoker  . Smokeless tobacco: Never Used  Substance Use Topics  . Alcohol use: Yes  . Drug use: Yes    Types: Marijuana    Home Medications Prior to Admission medications   Medication Sig Start Date End Date Taking? Authorizing Provider  acetaminophen  (TYLENOL) 500 MG tablet Take 500 mg by mouth daily as needed for mild pain.    [provider]  dextromethorphan-guaiFENesin (MUCINEX DM) 30-600 MG 12hr tablet Take 1 tablet by mouth 2 (two) times daily. 06/23/18   Vassie Loll, MD  folic acid (FOLVITE) 1 MG tablet Take 1 tablet (1 mg total) by mouth daily. 06/24/18   Vassie Loll, MD  Multiple Vitamin (MULTIVITAMIN WITH MINERALS) TABS tablet Take 1 tablet by mouth daily. 06/24/18   Vassie Loll, MD  nicotine (NICODERM CQ - DOSED IN MG/24 HOURS) 21 mg/24hr patch Place 1 patch (21 mg total) onto the skin daily. 06/24/18   Vassie Loll, MD  saccharomyces boulardii (FLORASTOR) 250 MG capsule Take 1 capsule (250 mg total) by mouth 2 (two) times daily. 06/23/18   Vassie Loll, MD  thiamine 100 MG tablet Take 1 tablet (100 mg total) by mouth daily. 06/24/18   Vassie Loll, MD    Allergies    Patient has no known allergies.  Review of Systems   Review of Systems  Constitutional: Positive for fatigue. Negative for fever.  Respiratory: Positive for cough and shortness of breath.   Cardiovascular: Negative for chest pain.  Gastrointestinal: Positive for vomiting. Negative for blood in stool.  All other systems reviewed and are negative.   Physical Exam Updated Vital Signs BP (!) 109/93   Pulse 98   Temp 98.4 F (36.9 C) (Oral)  Resp 22   Ht 1.727 m (5\' 8" )   Wt 49.9 kg   SpO2 96%   BMI 16.73 kg/m   Physical Exam CONSTITUTIONAL: Thin appearing, appears older than stated age HEAD: Normocephalic/atraumatic EYES: EOMI/PERRL ENMT: Mucous membranes dry NECK: supple no meningeal signs SPINE/BACK:entire spine nontender CV: S1/S2 noted, no murmurs/rubs/gallops noted LUNGS: Decreased breath sounds in the right, mild tachypnea noted ABDOMEN: soft, nontender, no rebound or guarding, bowel sounds noted throughout abdomen GU:no cva tenderness NEURO: Pt is awake/alert/appropriate, moves all extremitiesx4.  No facial droop.  No arm  or leg drift noted EXTREMITIES: pulses normal/equal, full ROM SKIN: warm, color normal PSYCH: no abnormalities of mood noted, alert and oriented to situation  ED Results / Procedures / Treatments   Labs (all labs ordered are listed, but only abnormal results are displayed) Labs Reviewed  SARS CORONAVIRUS 2 BY RT PCR (HOSPITAL ORDER, PERFORMED IN [facility]) - Abnormal; Notable for the following components:      Result Value   SARS Coronavirus 2 POSITIVE (*)    All other components within normal limits  COMPREHENSIVE METABOLIC PANEL - Abnormal; Notable for the following components:   Sodium 131 (*)    CO2 18 (*)    Creatinine, Ser 2.24 (*)    Calcium 8.2 (*)    AST 87 (*)    ALT 64 (*)    GFR calc non Af Amer 33 (*)    GFR calc Af Amer 38 (*)    All other components within normal limits  RAPID URINE DRUG SCREEN, HOSP PERFORMED - Abnormal; Notable for the following components:   Tetrahydrocannabinol POSITIVE (*)    All other components within normal limits  BASIC METABOLIC PANEL - Abnormal; Notable for the following components:   Sodium 133 (*)    Creatinine, Ser 2.00 (*)    Calcium 7.9 (*)    GFR calc non Af Amer 38 (*)    GFR calc Af Amer 44 (*)    All other components within normal limits  CBC WITH DIFFERENTIAL/PLATELET  ETHANOL    EKG EKG Interpretation  Date/Time:  Sunday August 30 2019 23:18:23 EDT Ventricular Rate:  98 PR Interval:    QRS Duration: 77 QT Interval:  342 QTC Calculation: 435 R Axis:   81 Text Interpretation: Sinus rhythm Biatrial enlargement Probable anteroseptal infarct, old Interpretation limited secondary to artifact Confirmed by Asja Frommer ([id number]) on 08/30/2019 11:39:26 PM   Radiology No results found.  Procedures Procedures   Medications Ordered in ED Medications  lactated ringers bolus 1,000 mL (0 mLs Intravenous Stopped 08/31/19 0140)  lactated ringers bolus 1,000 mL (0 mLs Intravenous Stopped 08/31/19 0437)    ED  Course  I have reviewed the triage vital signs and the nursing notes.  Pertinent labs & imaging results that were available during my care of the patient were reviewed by me and considered in my medical decision making (see chart for details).    MDM Rules/Calculators/A&P                          12 :17 AM Patient presents with shortness of breath the past 2 days and dizziness for the past week.  Per EMS patient was orthostatic and required IV fluids. 5:08 AM Patient monitored for several hours.  He was found to have acute kidney injury.  He was given 2 L of fluid with improvement.  He is now ambulatory without difficulty.  No dyspnea  on exertion.  No hypoxia.  Patient was incidentally found to have COVID-19.  I counseled patient on quarantining until his symptoms resolve.  Also advised patient to cut back on his alcohol use.  He was given referral to [facility] in [place] as he has no local PCP.  I advised he will need repeat kidney levels in the next 2 weeks Final Clinical Impression(s) / ED Diagnoses Final diagnoses:  Dehydration  AKI (acute kidney injury) (HCC)  COVID-19  Alcohol abuse    Rx / DC Orders ED Discharge Orders    None       Zadie Rhine, MD 08/31/19 [phone]

## 2019-08-31 NOTE — ED Notes (Signed)
Pt did not give enough urine for UA. Lab ran drug screen

## 2019-08-31 NOTE — ED Notes (Signed)
Date and time results received: 08/31/19 0205  Test: COVID Critical Value: positive  Name of Provider Notified: Wickline, MD  Orders Received? Or Actions Taken?: acknowledged 

## 2019-08-31 NOTE — ED Notes (Signed)
Pt aware to give more urine for sample

## 2019-09-01 ENCOUNTER — Telehealth: Payer: Self-pay | Admitting: Infectious Diseases

## 2019-09-01 NOTE — Telephone Encounter (Signed)
Called to discuss with patient about Covid symptoms and the use of Regeneron, a monoclonal antibody infusion for those with mild to moderate Covid symptoms and at a high risk of hospitalization.  Pt is qualified for this infusion at the [facility] infusion center due to high risk racial group, active smoker, COPD.   Based on ER visit 7/25 he was having 2 days of worsening SOB/dyspnea. He was found to be severely dehydrated with AKI. Re-hydrated and monitored for a few hours. CXR was without viral pna. In speaking to his son he has been having symptoms for a week today.   His son asked me to call back about 5 pm tonight. I will try again this afternoon around 4:00 pm to try to reach him to schedule at [facility] 7/28.    Rexene Alberts, MSN, NP-C [facility] for Infectious Disease [facility]  [email or web address] Pager: [phone] Office: [phone] [facility] Main Line: [phone]

## 2020-10-28 ENCOUNTER — Other Ambulatory Visit: Payer: Self-pay

## 2020-10-28 ENCOUNTER — Encounter (HOSPITAL_COMMUNITY): Payer: Self-pay

## 2020-10-28 ENCOUNTER — Emergency Department (HOSPITAL_COMMUNITY)
Admission: EM | Admit: 2020-10-28 | Discharge: 2020-10-28 | Disposition: A | Discharge status: Home / Self Care | Payer: Medicaid Other | Attending: Emergency Medicine | Admitting: Emergency Medicine

## 2020-10-28 ENCOUNTER — Emergency Department (HOSPITAL_COMMUNITY): Payer: Medicaid Other

## 2020-10-28 DIAGNOSIS — M25572 Pain in left ankle and joints of left foot: Secondary | ICD-10-CM | POA: Insufficient documentation

## 2020-10-28 DIAGNOSIS — S93402A Sprain of unspecified ligament of left ankle, initial encounter: Secondary | ICD-10-CM

## 2020-10-28 DIAGNOSIS — Y9301 Activity, walking, marching and hiking: Secondary | ICD-10-CM | POA: Insufficient documentation

## 2020-10-28 DIAGNOSIS — Y92009 Unspecified place in unspecified non-institutional (private) residence as the place of occurrence of the external cause: Secondary | ICD-10-CM | POA: Insufficient documentation

## 2020-10-28 DIAGNOSIS — F17200 Nicotine dependence, unspecified, uncomplicated: Secondary | ICD-10-CM | POA: Insufficient documentation

## 2020-10-28 DIAGNOSIS — X500XXA Overexertion from strenuous movement or load, initial encounter: Secondary | ICD-10-CM | POA: Diagnosis not present

## 2020-10-28 NOTE — Discharge Instructions (Addendum)
Rest.  Wear Ace bandage for comfort and support.  Ice for 20 minutes every 2 hours while awake for the next 2 days.  Weightbearing as tolerated and follow-up with primary doctor if not improving in the next week.

## 2020-10-28 NOTE — ED Provider Notes (Signed)
[facility] EMERGENCY DEPARTMENT Provider Note   CSN: [id number] Arrival date & time: 10/28/20  0435     History No chief complaint on file.   Carl Evans is a 50 y.o. male.  Patient is a 50 year old male presenting with complaints of left ankle pain.  Patient reports walking downstairs and turning his ankle.  He was brought by EMS for evaluation of this.  Pain is worse with ambulation and relieved with rest.  The history is provided by the patient.      Past Medical History:  Diagnosis Date   Alcohol abuse 06/20/2018   Tobacco use 06/20/2018    Patient Active Problem List   Diagnosis Date Noted   CAP (community acquired pneumonia) 06/20/2018   Hypokalemia 06/20/2018   Hyponatremia 06/20/2018   Anemia 06/20/2018   Tobacco use 06/20/2018   Alcohol abuse 06/20/2018    No past surgical history on file.     Family History  Problem Relation Age of Onset   Thyroid disease Mother    Kidney disease Father    Thyroid disease Sister     Social History   Tobacco Use   Smoking status: Every Day   Smokeless tobacco: Never  Substance Use Topics   Alcohol use: Yes   Drug use: Yes    Types: Marijuana    Home Medications Prior to Admission medications   Medication Sig Start Date End Date Taking? Authorizing Provider  folic acid (FOLVITE) 1 MG tablet Take 1 tablet (1 mg total) by mouth daily. 06/24/18   Vassie Loll, MD  Multiple Vitamin (MULTIVITAMIN WITH MINERALS) TABS tablet Take 1 tablet by mouth daily. 06/24/18   Vassie Loll, MD  nicotine (NICODERM CQ - DOSED IN MG/24 HOURS) 21 mg/24hr patch Place 1 patch (21 mg total) onto the skin daily. 06/24/18   Vassie Loll, MD  saccharomyces boulardii (FLORASTOR) 250 MG capsule Take 1 capsule (250 mg total) by mouth 2 (two) times daily. 06/23/18   Vassie Loll, MD  thiamine 100 MG tablet Take 1 tablet (100 mg total) by mouth daily. 06/24/18   Vassie Loll, MD    Allergies    Patient has no known  allergies.  Review of Systems   Review of Systems  All other systems reviewed and are negative.  Physical Exam Updated Vital Signs There were no vitals taken for this visit.  Physical Exam Vitals and nursing note reviewed.  Constitutional:      General: He is not in acute distress.    Appearance: Normal appearance. He is not ill-appearing.  HENT:     Head: Normocephalic and atraumatic.  Pulmonary:     Effort: Pulmonary effort is normal.  Musculoskeletal:     Comments: The left ankle appears grossly normal.  There is no obvious swelling or deformity.  DP pulses are palpable and motor and sensation are intact throughout the entire foot.  Skin:    General: Skin is warm and dry.  Neurological:     Mental Status: He is alert.    ED Results / Procedures / Treatments   Labs (all labs ordered are listed, but only abnormal results are displayed) Labs Reviewed - No data to display  EKG None  Radiology No results found.  Procedures Procedures   Medications Ordered in ED Medications - No data to display  ED Course  I have reviewed the triage vital signs and the nursing notes.  Pertinent labs & imaging results that were available during my care of the patient  were reviewed by me and considered in my medical decision making (see chart for details).    MDM Rules/Calculators/A&P  X-rays negative for fracture.  Patient to be discharged home with an Ace bandage, rest, and follow-up as needed.  Final Clinical Impression(s) / ED Diagnoses Final diagnoses:  None    Rx / DC Orders ED Discharge Orders     None        Geoffery Lyons, MD 10/28/20 [phone]

## 2020-10-28 NOTE — ED Triage Notes (Addendum)
Rcems from home with cc of rolling his left ankle while going down some stairs. States that he denies any other injuries. 9/10 pain.  States he only had one beer tonight.  Slight swelling noted

## 2020-10-28 NOTE — ED Notes (Signed)
Pt rec'd d/c papers. Going to lobby to wait on ride.

## 2021-12-25 ENCOUNTER — Encounter (HOSPITAL_COMMUNITY): Payer: Self-pay

## 2021-12-25 ENCOUNTER — Other Ambulatory Visit: Payer: Self-pay

## 2021-12-25 ENCOUNTER — Emergency Department (HOSPITAL_COMMUNITY)
Admission: EM | Admit: 2021-12-25 | Discharge: 2021-12-26 | Disposition: A | Discharge status: Home / Self Care | Payer: Medicaid Other | Attending: Emergency Medicine | Admitting: Emergency Medicine

## 2021-12-25 DIAGNOSIS — F1020 Alcohol dependence, uncomplicated: Secondary | ICD-10-CM | POA: Insufficient documentation

## 2021-12-25 DIAGNOSIS — R519 Headache, unspecified: Secondary | ICD-10-CM | POA: Insufficient documentation

## 2021-12-25 DIAGNOSIS — F1090 Alcohol use, unspecified, uncomplicated: Secondary | ICD-10-CM

## 2021-12-25 NOTE — ED Triage Notes (Signed)
Pt presents with headache x4 days. Pt is everyday drinker and last drink was today.

## 2021-12-26 ENCOUNTER — Emergency Department (HOSPITAL_COMMUNITY): Payer: Medicaid Other

## 2021-12-26 LAB — CBC WITH DIFFERENTIAL/PLATELET
Abs Immature Granulocytes: 0.01 10*3/uL (ref 0.00–0.07)
Basophils Absolute: 0.1 10*3/uL (ref 0.0–0.1)
Basophils Relative: 1 %
Eosinophils Absolute: 0.1 10*3/uL (ref 0.0–0.5)
Eosinophils Relative: 2 %
HCT: 41.3 % (ref 39.0–52.0)
Hemoglobin: 14.6 g/dL (ref 13.0–17.0)
Immature Granulocytes: 0 %
Lymphocytes Relative: 33 %
Lymphs Abs: 2 10*3/uL (ref 0.7–4.0)
MCH: 33 pg (ref 26.0–34.0)
MCHC: 35.4 g/dL (ref 30.0–36.0)
MCV: 93.4 fL (ref 80.0–100.0)
Monocytes Absolute: 0.6 10*3/uL (ref 0.1–1.0)
Monocytes Relative: 10 %
Neutro Abs: 3.3 10*3/uL (ref 1.7–7.7)
Neutrophils Relative %: 54 %
Platelets: 358 10*3/uL (ref 150–400)
RBC: 4.42 MIL/uL (ref 4.22–5.81)
RDW: 14.3 % (ref 11.5–15.5)
WBC: 6.1 10*3/uL (ref 4.0–10.5)
nRBC: 0 % (ref 0.0–0.2)

## 2021-12-26 LAB — COMPREHENSIVE METABOLIC PANEL
ALT: 17 U/L (ref 0–44)
AST: 33 U/L (ref 15–41)
Albumin: 4.2 g/dL (ref 3.5–5.0)
Alkaline Phosphatase: 78 U/L (ref 38–126)
Anion gap: 10 (ref 5–15)
BUN: 11 mg/dL (ref 6–20)
CO2: 26 mmol/L (ref 22–32)
Calcium: 8.8 mg/dL — ABNORMAL LOW (ref 8.9–10.3)
Chloride: 104 mmol/L (ref 98–111)
Creatinine, Ser: 1.04 mg/dL (ref 0.61–1.24)
GFR, Estimated: >60 mL/min (ref >60)
Glucose, Bld: 100 mg/dL — ABNORMAL HIGH (ref 70–99)
Potassium: 3.9 mmol/L (ref 3.5–5.1)
Sodium: 140 mmol/L (ref 135–145)
Total Bilirubin: 0.3 mg/dL (ref 0.3–1.2)
Total Protein: 8.1 g/dL (ref 6.5–8.1)

## 2021-12-26 LAB — ETHANOL: Alcohol, Ethyl (B): 429 mg/dL (ref <10)

## 2021-12-26 NOTE — ED Provider Notes (Signed)
[facility] EMERGENCY DEPARTMENT  Provider Note  CSN: [id number] Arrival date & time: 12/25/21 2310  History Chief Complaint  Patient presents with   Headache    Carl Evans is a 51 y.o. male with history of alcohol use brought to the ED via EMS for reports of headache. Patient's history is difficult to obtain due to intoxication but per EMS he has had a headache for the last 4 days. He tells me he 'can see me but can't see me'. He denies any head injury but not sure how reliable that history is either.    Home Medications Prior to Admission medications   Medication Sig Start Date End Date Taking? Authorizing Provider  folic acid (FOLVITE) 1 MG tablet Take 1 tablet (1 mg total) by mouth daily. 06/24/18   Vassie Loll, MD  Multiple Vitamin (MULTIVITAMIN WITH MINERALS) TABS tablet Take 1 tablet by mouth daily. 06/24/18   Vassie Loll, MD  nicotine (NICODERM CQ - DOSED IN MG/24 HOURS) 21 mg/24hr patch Place 1 patch (21 mg total) onto the skin daily. 06/24/18   Vassie Loll, MD  saccharomyces boulardii (FLORASTOR) 250 MG capsule Take 1 capsule (250 mg total) by mouth 2 (two) times daily. 06/23/18   Vassie Loll, MD  thiamine 100 MG tablet Take 1 tablet (100 mg total) by mouth daily. 06/24/18   Vassie Loll, MD     Allergies    Patient has no known allergies.   Review of Systems   Review of Systems Please see HPI for pertinent positives and negatives  Physical Exam BP 122/80   Pulse 93   Temp 98.1 F (36.7 C)   Resp 18   Ht 5\' 8"  (1.727 m)   Wt 54.4 kg   SpO2 100%   BMI 18.25 kg/m   Physical Exam Vitals and nursing note reviewed.  Constitutional:      Appearance: Normal appearance.     Comments: intoxicated  HENT:     Head: Normocephalic and atraumatic.     Nose: Nose normal.     Mouth/Throat:     Mouth: Mucous membranes are moist.  Eyes:     Extraocular Movements: Extraocular movements intact.     Conjunctiva/sclera: Conjunctivae normal.     Comments:  deformed R pupil appears chronic; visual acuity is grossly intact  Cardiovascular:     Rate and Rhythm: Normal rate.  Pulmonary:     Effort: Pulmonary effort is normal.     Breath sounds: Normal breath sounds.  Abdominal:     General: Abdomen is flat.     Palpations: Abdomen is soft.     Tenderness: There is no abdominal tenderness.  Musculoskeletal:        General: No swelling. Normal range of motion.     Cervical back: Neck supple.  Skin:    General: Skin is warm and dry.  Neurological:     General: No focal deficit present.     Mental Status: He is alert.     Cranial Nerves: No cranial nerve deficit.     Motor: No weakness.  Psychiatric:        Mood and Affect: Mood normal.     ED Results / Procedures / Treatments   EKG None  Procedures Procedures  Medications Ordered in the ED Medications - No data to display  Initial Impression and Plan  Patient with EtOH on board here for headache, not entire sure why he called EMS tonight. He is reporting some vague visual  changes, again not clear what the chronicity is. Given uncertain history and apparent intoxication, will check labs and send for CT.   ED Course   Clinical Course as of 12/26/21 0217  Tue Dec 26, 2021  0138 CBC and CMP are unremarkable.  [CS]  0159 I personally viewed the images from radiology studies and agree with radiologist interpretation:  CT is neg for acute process.  EtOH is markedly elevated consistent with history.  [CS]  0216 Patient sitting on side of the bed asking for discharge. Despite his elevated EtOH he is awake alert and steady on his feet. He has friends at bedside who are sober and willing to take him home. Advised to curtail his EtOH use. RTED for any other concerns.  [CS]    Clinical Course User Index [CS] Pollyann Savoy, MD     MDM Rules/Calculators/A&P Medical Decision Making Problems Addressed: Acute nonintractable headache, unspecified headache type: acute illness or  injury Alcohol use disorder: chronic illness or injury  Amount and/or Complexity of Data Reviewed Labs: ordered. Decision-making details documented in ED Course. Radiology: ordered and independent interpretation performed. Decision-making details documented in ED Course.    Final Clinical Impression(s) / ED Diagnoses Final diagnoses:  Acute nonintractable headache, unspecified headache type  Alcohol use disorder    Rx / DC Orders ED Discharge Orders     None        Pollyann Savoy, MD 12/26/21 [phone]

## 2021-12-26 NOTE — ED Notes (Signed)
Patient transported to CT 

## 2022-04-02 IMAGING — DX DG ANKLE COMPLETE 3+V*L*
3 series · 3 of 3 positions shown · non-contrast
Comparison: None.

CLINICAL DATA: 50-year-old male status post fall down stairs with
pain.

EXAM:
LEFT ANKLE COMPLETE - 3+ VIEW

[ankle ap]
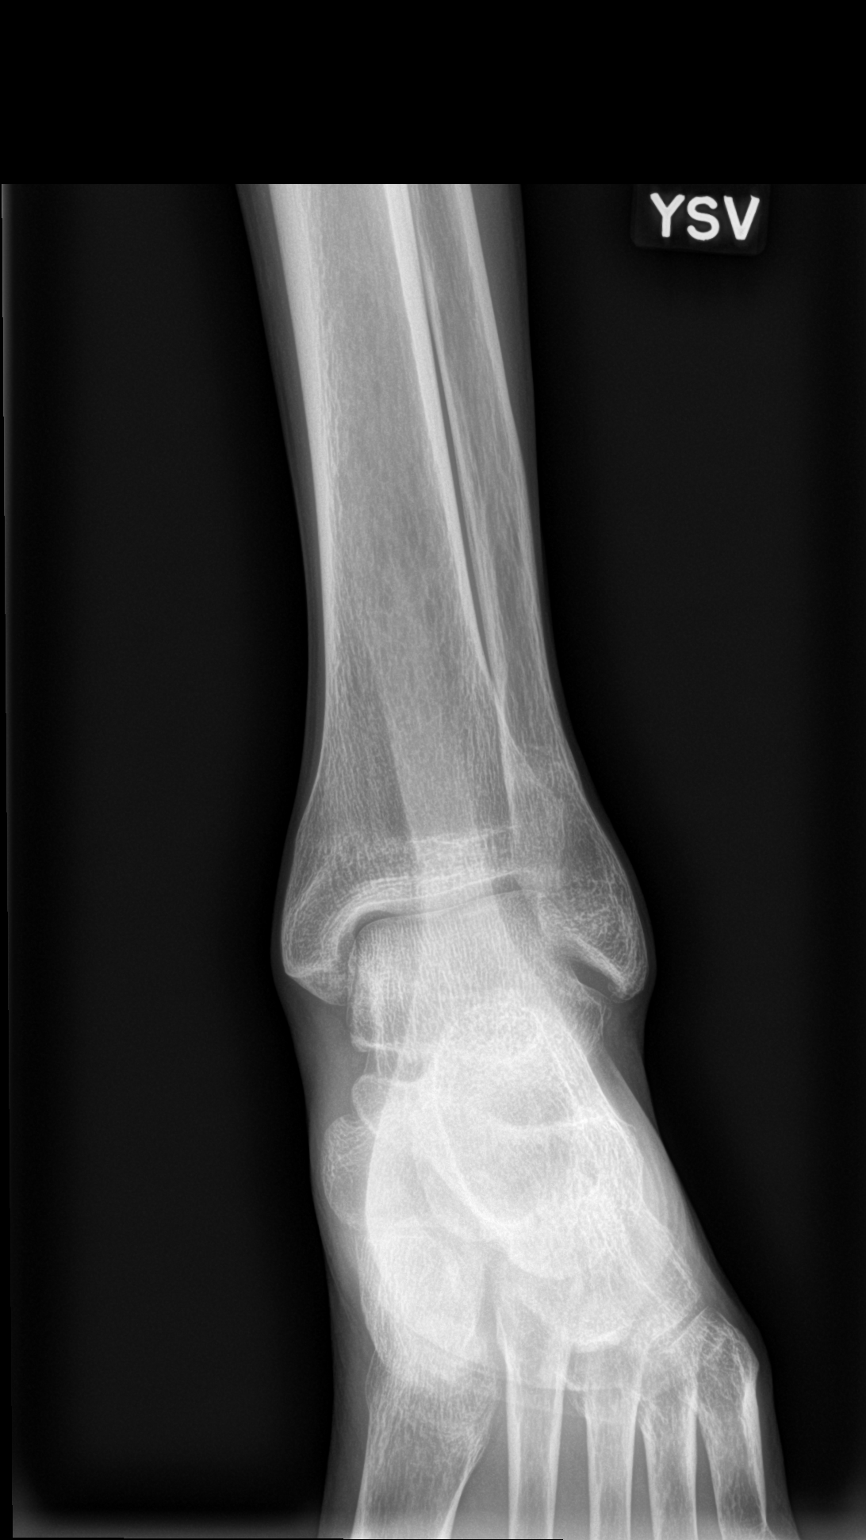

[ankle obl]
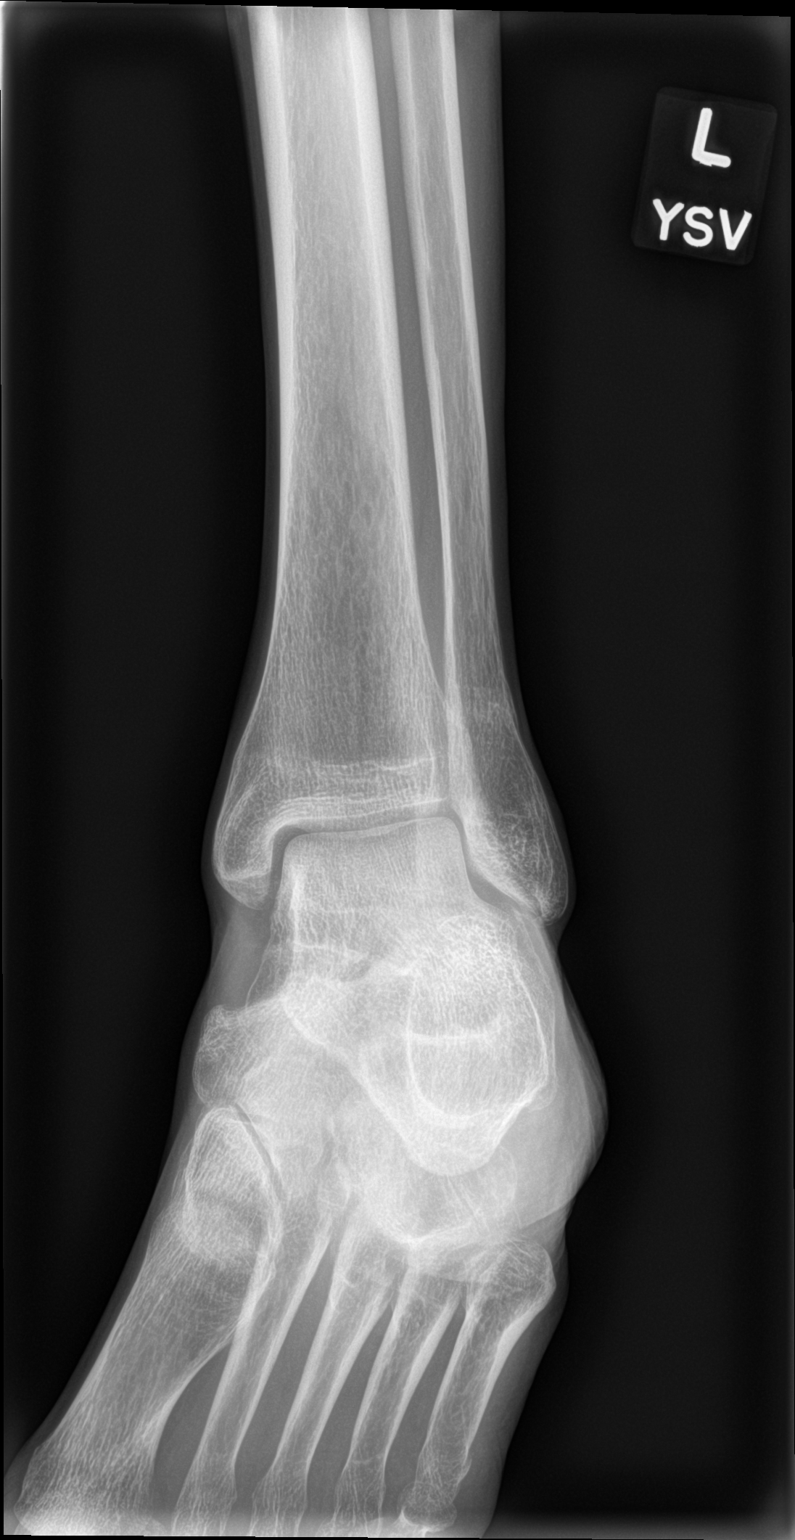

[ankle lat]
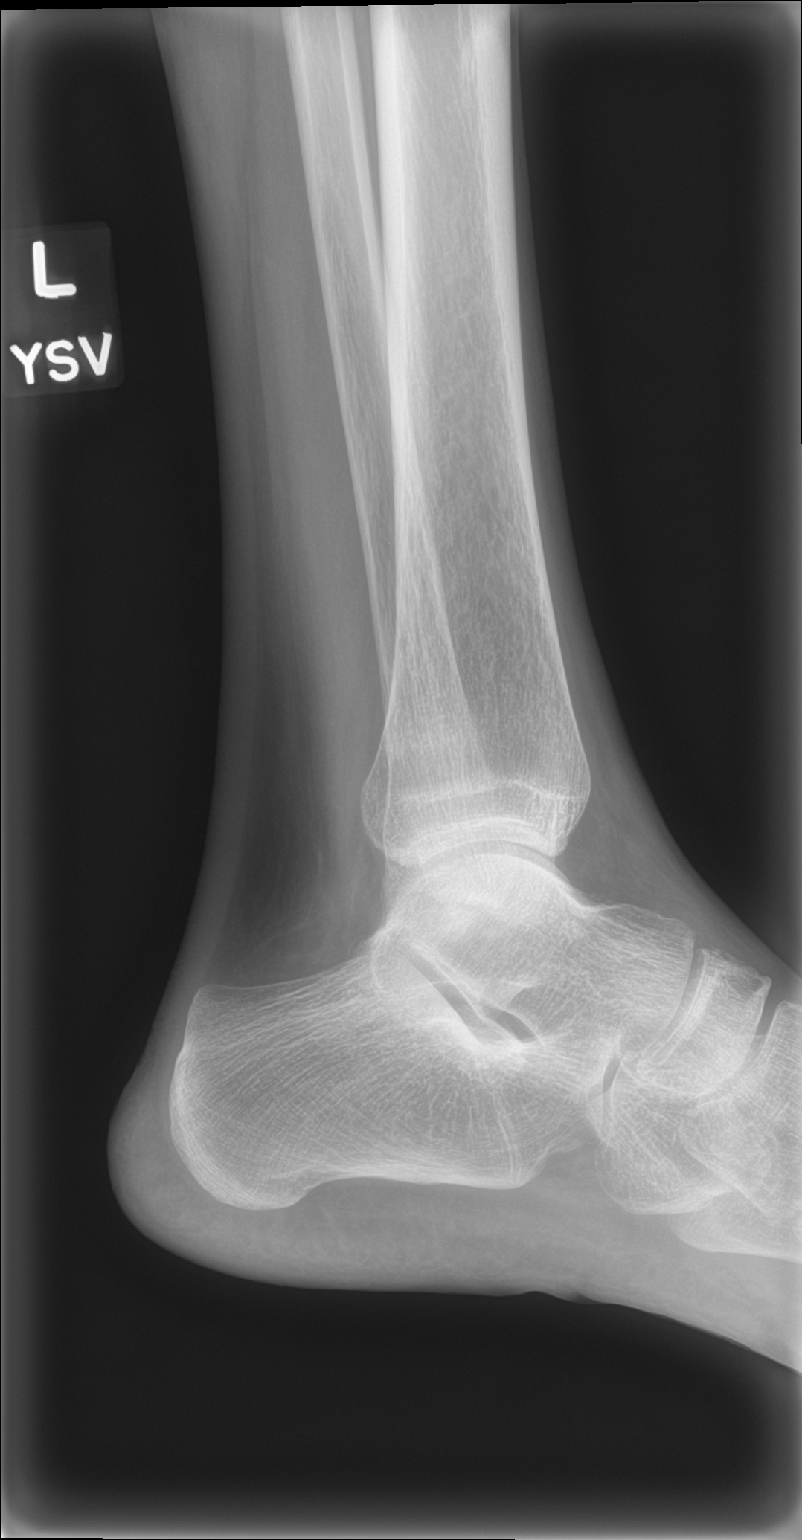

[3 of 3 positions shown; findings below may reference images not displayed]

FINDINGS: Bone mineralization is within normal limits. There is no evidence of
fracture, dislocation, or joint effusion. There is no evidence of
arthropathy or other focal bone abnormality. Soft tissue swelling
noted overlying the posterior calcaneus which appears to remain
intact.
IMPRESSION: Soft tissue swelling at the heel. No acute fracture or dislocation
identified about the left ankle.

## 2022-07-07 ENCOUNTER — Emergency Department (HOSPITAL_COMMUNITY)
Admission: EM | Admit: 2022-07-07 | Discharge: 2022-07-07 | Disposition: A | Discharge status: Home / Self Care | Payer: Medicaid Other | Attending: Student | Admitting: Student

## 2022-07-07 ENCOUNTER — Other Ambulatory Visit: Payer: Self-pay

## 2022-07-07 ENCOUNTER — Emergency Department (HOSPITAL_COMMUNITY): Payer: Medicaid Other

## 2022-07-07 DIAGNOSIS — W108XXA Fall (on) (from) other stairs and steps, initial encounter: Secondary | ICD-10-CM | POA: Diagnosis not present

## 2022-07-07 DIAGNOSIS — S93402A Sprain of unspecified ligament of left ankle, initial encounter: Secondary | ICD-10-CM | POA: Insufficient documentation

## 2022-07-07 DIAGNOSIS — S90912A Unspecified superficial injury of left ankle, initial encounter: Secondary | ICD-10-CM | POA: Diagnosis present

## 2022-07-07 MED ORDER — ACETAMINOPHEN 500 MG PO TABS
1000.0000 mg | ORAL_TABLET | Freq: Once | ORAL | Status: AC
  Administered 2022-07-07: 1000 mg via ORAL
  Filled 2022-07-07: qty 2

## 2022-07-07 NOTE — ED Provider Notes (Signed)
[place] EMERGENCY DEPARTMENT AT [facility] Provider Note   CSN: [id number] Arrival date & time: 07/07/22  1229     History  Chief Complaint  Patient presents with   Ankle Pain    Carl Evans is a 52 y.o. male.  With no significant past medical history who presents to the ED for evaluation of left ankle pain.  He was walking down some steps 2 days ago when he inverted his left ankle.  He fell to his knee.  Did not hit his head or lose consciousness.  Does not take any blood thinners.  States his ankle has been in pain since that time.  He took Tylenol yesterday with minimal improvement.  Pain is making it difficult to walk.  Also complaining of left foot pain.  Denies numbness, weakness or tingling.  No left knee or hip pain.  No contralateral ankle pain.   Ankle Pain      Home Medications Prior to Admission medications   Medication Sig Start Date End Date Taking? Authorizing Provider  folic acid (FOLVITE) 1 MG tablet Take 1 tablet (1 mg total) by mouth daily. 06/24/18   Vassie Loll, MD  Multiple Vitamin (MULTIVITAMIN WITH MINERALS) TABS tablet Take 1 tablet by mouth daily. 06/24/18   Vassie Loll, MD  nicotine (NICODERM CQ - DOSED IN MG/24 HOURS) 21 mg/24hr patch Place 1 patch (21 mg total) onto the skin daily. 06/24/18   Vassie Loll, MD  saccharomyces boulardii (FLORASTOR) 250 MG capsule Take 1 capsule (250 mg total) by mouth 2 (two) times daily. 06/23/18   Vassie Loll, MD  thiamine 100 MG tablet Take 1 tablet (100 mg total) by mouth daily. 06/24/18   Vassie Loll, MD      Allergies    Patient has no known allergies.    Review of Systems   Review of Systems  Musculoskeletal:  Positive for arthralgias.  All other systems reviewed and are negative.   Physical Exam Updated Vital Signs BP 115/87   Pulse 95   Temp 98 F (36.7 C) (Oral)   Resp 17   SpO2 100%  Physical Exam Vitals and nursing note reviewed.  Constitutional:      General: He is not  in acute distress.    Appearance: He is normal weight. He is not ill-appearing.     Comments: unkempt  HENT:     Head: Normocephalic and atraumatic.  Pulmonary:     Effort: Pulmonary effort is normal. No respiratory distress.  Abdominal:     General: Abdomen is flat.  Musculoskeletal:     Cervical back: Neck supple.     Comments: Decreased ROM in plantarflexion and dorsiflexion of the left ankle secondary to pain.  Full AROM of all toes.  Minimal TTP to medial and lateral malleoli.  Mild dorsal midfoot TTP as well.  No swelling or deformities.  DP pulse 2+.  Sensation intact in all digits.  Capillary refill normal.  Compartments soft.  Skin:    General: Skin is warm and dry.     Capillary Refill: Capillary refill takes less than 2 seconds.  Neurological:     Mental Status: He is alert and oriented to person, place, and time.  Psychiatric:        Mood and Affect: Mood normal.        Behavior: Behavior normal.     ED Results / Procedures / Treatments   Labs (all labs ordered are listed, but only abnormal results are  displayed) Labs Reviewed - No data to display  EKG None  Radiology DG Ankle Complete Left  Result Date: 07/07/2022 CLINICAL DATA:  Fall.  Left ankle pain. EXAM: LEFT ANKLE COMPLETE - 3+ VIEW COMPARISON:  None Available. FINDINGS: There is no evidence of fracture, dislocation, or joint effusion. There is no evidence of arthropathy or other focal bone abnormality. Soft tissues are unremarkable. IMPRESSION: Negative. Electronically Signed   By: Amie Portland M.D.   On: 07/07/2022 13:36   DG Foot Complete Left  Result Date: 07/07/2022 CLINICAL DATA:  Fall.  Foot pain. EXAM: LEFT FOOT - COMPLETE 3+ VIEW COMPARISON:  None Available. FINDINGS: There is no evidence of fracture or dislocation. There is no evidence of arthropathy or other focal bone abnormality. Soft tissues are unremarkable. IMPRESSION: Negative. Electronically Signed   By: Amie Portland M.D.   On: 07/07/2022  13:35    Procedures Procedures    Medications Ordered in ED Medications  acetaminophen (TYLENOL) tablet 1,000 mg (1,000 mg Oral Given 07/07/22 1350)    ED Course/ Medical Decision Making/ A&P                             Medical Decision Making Amount and/or Complexity of Data Reviewed Radiology: ordered.  Risk OTC drugs.  This patient presents to the ED for concern of left ankle pain, this involves an extensive number of treatment options.  The differential diagnosis includes fracture, strain, sprain, contusion, dislocation  My initial workup includes imaging, pain control  Additional history obtained from: Nursing notes from this visit.  I ordered imaging studies including x-ray left foot and ankle I independently visualized and interpreted imaging which showed normal I agree with the radiologist interpretation  Afebrile, hemodynamically stable.  52 year old male presents to the ED for evaluation of left ankle pain.  He inverted his ankle 2 days ago.  On exam he has mild TTP to bilateral malleoli into the left dorsal midfoot.  X-ray imaging negative.  Neurovascular status intact.  Compartments are soft.  No obvious deformities.  Likely sprain.  He was given a cam walker boot in the ED.  He was encouraged to follow-up with his PCP.  Stable at discharge.  At this time there does not appear to be any evidence of an acute emergency medical condition and the patient appears stable for discharge with appropriate outpatient follow up. Diagnosis was discussed with patient who verbalizes understanding of care plan and is agreeable to discharge. I have discussed return precautions with patient who verbalizes understanding. Patient encouraged to follow-up with their PCP within 1 week. All questions answered.  Note: Portions of this report may have been transcribed using voice recognition software. Every effort was made to ensure accuracy; however, inadvertent computerized transcription  errors may still be present.        Final Clinical Impression(s) / ED Diagnoses Final diagnoses:  Sprain of left ankle, unspecified ligament, initial encounter    Rx / DC Orders ED Discharge Orders     None         Michelle Piper, Cordelia Poche 07/07/22 1406    Kommor, Wyn Forster, MD 07/08/22 1025

## 2022-07-07 NOTE — ED Triage Notes (Signed)
Pt reports slipping on some steps 2 days ago and injuring left ankle.  Has trouble walking on it. Pulses intact.  States this was a trip and fall. No LOC

## 2022-07-07 NOTE — Discharge Instructions (Addendum)
You have been seen today for your complaint of left ankle pain. Your imaging was reassuring and showed no abnormalities. Your discharge medications include Alternate tylenol and ibuprofen for pain. You may alternate these every 4 hours. You may take up to 800 mg of ibuprofen at a time and up to 1000 mg of tylenol. Home care instructions are as follows:  Wear the boot when walking.  Rest is much as possible.  Ice and elevate the extremity until your symptoms improve. Follow up with: Your primary care provider in 1 week for reevaluation Please seek immediate medical care if you develop any of the following symptoms: Your foot or toes become numb or blue. You have severe pain that gets worse. At this time there does not appear to be the presence of an emergent medical condition, however there is always the potential for conditions to change. Please read and follow the below instructions.  Do not take your medicine if  develop an itchy rash, swelling in your mouth or lips, or difficulty breathing; call 911 and seek immediate emergency medical attention if this occurs.  You may review your lab tests and imaging results in their entirety on your MyChart account.  Please discuss all results of fully with your primary care provider and other specialist at your follow-up visit.  Note: Portions of this text may have been transcribed using voice recognition software. Every effort was made to ensure accuracy; however, inadvertent computerized transcription errors may still be present.

## 2023-04-02 ENCOUNTER — Encounter (HOSPITAL_COMMUNITY): Payer: Self-pay

## 2023-04-02 ENCOUNTER — Emergency Department (HOSPITAL_COMMUNITY): Payer: Medicaid Other

## 2023-04-02 ENCOUNTER — Other Ambulatory Visit: Payer: Self-pay

## 2023-04-02 ENCOUNTER — Emergency Department (HOSPITAL_COMMUNITY)
Admission: EM | Admit: 2023-04-02 | Discharge: 2023-04-02 | Disposition: A | Discharge status: Home / Self Care | Payer: Medicaid Other | Attending: Emergency Medicine | Admitting: Emergency Medicine

## 2023-04-02 DIAGNOSIS — R051 Acute cough: Secondary | ICD-10-CM | POA: Diagnosis not present

## 2023-04-02 DIAGNOSIS — R059 Cough, unspecified: Secondary | ICD-10-CM | POA: Diagnosis present

## 2023-04-02 DIAGNOSIS — F17200 Nicotine dependence, unspecified, uncomplicated: Secondary | ICD-10-CM | POA: Insufficient documentation

## 2023-04-02 LAB — RESP PANEL BY RT-PCR (RSV, FLU A&B, COVID)  RVPGX2
Influenza A by PCR: NEGATIVE
Influenza B by PCR: NEGATIVE
Resp Syncytial Virus by PCR: NEGATIVE
SARS Coronavirus 2 by RT PCR: NEGATIVE

## 2023-04-02 MED ORDER — PREDNISONE 20 MG PO TABS: ORAL_TABLET | ORAL | 0 refills | Status: DC

## 2023-04-02 MED ORDER — AEROCHAMBER PLUS FLO-VU MISC
1.0000 | Freq: Once | Status: AC
  Administered 2023-04-02: 1
  Filled 2023-04-02: qty 1

## 2023-04-02 MED ORDER — PREDNISONE 20 MG PO TABS
40.0000 mg | ORAL_TABLET | Freq: Once | ORAL | Status: AC
  Administered 2023-04-02: 40 mg via ORAL
  Filled 2023-04-02: qty 2

## 2023-04-02 MED ORDER — ALBUTEROL SULFATE HFA 108 (90 BASE) MCG/ACT IN AERS
2.0000 | INHALATION_SPRAY | Freq: Once | RESPIRATORY_TRACT | Status: AC
  Administered 2023-04-02: 2 via RESPIRATORY_TRACT
  Filled 2023-04-02: qty 6.7

## 2023-04-02 NOTE — Discharge Instructions (Addendum)
 Please refer to the attached instructions. Use albuterol inhaler and take prednisone as directed.

## 2023-04-02 NOTE — ED Provider Notes (Signed)
 [place] EMERGENCY DEPARTMENT AT [facility] Provider Note   CSN: [id number] Arrival date & time: 04/02/23  1214     History  Chief Complaint  Patient presents with   Cough    Carl Evans is a 53 y.o. male.  53 year old male presents from home with complaint of cough onset one week ago. Some mild shortness of breath. No documented fevers. Denies abdominal pain, nausea, vomiting.  The history is provided by the patient. No language interpreter was used.  Cough Cough characteristics:  Non-productive Severity:  Moderate Onset quality:  Gradual Duration:  7 days Timing:  Intermittent Progression:  Waxing and waning Chronicity:  New Smoker: yes   Ineffective treatments:  None tried Associated symptoms: shortness of breath   Associated symptoms: no chills and no fever        Home Medications Prior to Admission medications   Medication Sig Start Date End Date Taking? Authorizing Provider  folic acid (FOLVITE) 1 MG tablet Take 1 tablet (1 mg total) by mouth daily. 06/24/18   Vassie Loll, MD  Multiple Vitamin (MULTIVITAMIN WITH MINERALS) TABS tablet Take 1 tablet by mouth daily. 06/24/18   Vassie Loll, MD  nicotine (NICODERM CQ - DOSED IN MG/24 HOURS) 21 mg/24hr patch Place 1 patch (21 mg total) onto the skin daily. 06/24/18   Vassie Loll, MD  saccharomyces boulardii (FLORASTOR) 250 MG capsule Take 1 capsule (250 mg total) by mouth 2 (two) times daily. 06/23/18   Vassie Loll, MD  thiamine 100 MG tablet Take 1 tablet (100 mg total) by mouth daily. 06/24/18   Vassie Loll, MD      Allergies    Patient has no known allergies.    Review of Systems   Review of Systems  Constitutional:  Negative for chills and fever.  Respiratory:  Positive for cough and shortness of breath.   All other systems reviewed and are negative.   Physical Exam Updated Vital Signs BP 102/77 (BP Location: Left Arm)   Pulse 92   Temp 97.7 F (36.5 C) (Oral)   Resp 18   Ht  5\' 8"  (1.727 m)   Wt 54.5 kg   SpO2 100%   BMI 18.27 kg/m  Physical Exam Vitals and nursing note reviewed.  Constitutional:      Appearance: Normal appearance.  HENT:     Head: Normocephalic.     Nose: Nose normal.  Eyes:     Pupils: Pupils are equal, round, and reactive to light.  Cardiovascular:     Rate and Rhythm: Normal rate.  Pulmonary:     Effort: Pulmonary effort is normal.  Abdominal:     Palpations: Abdomen is soft.  Musculoskeletal:        General: Normal range of motion.     Cervical back: Normal range of motion.  Skin:    General: Skin is warm and dry.  Neurological:     Mental Status: He is alert and oriented to person, place, and time.  Psychiatric:        Mood and Affect: Mood normal.        Behavior: Behavior normal.     ED Results / Procedures / Treatments   Labs (all labs ordered are listed, but only abnormal results are displayed) Labs Reviewed  RESP PANEL BY RT-PCR (RSV, FLU A&B, COVID)  [id number]    EKG None  Radiology DG Chest Port 1 View Result Date: 04/02/2023 CLINICAL DATA:  Cough. EXAM: PORTABLE CHEST 1 VIEW  COMPARISON:  Chest radiograph dated 08/31/2019. FINDINGS: The heart size and mediastinal contours are within normal limits. Hyperinflation with coarsening of the interstitial markings, compatible with COPD. No focal consolidation, pleural effusion, or pneumothorax. No acute osseous abnormality. IMPRESSION: 1. No acute cardiopulmonary findings. 2. COPD. Electronically Signed   By: Hart Robinsons M.D.   On: 04/02/2023 13:43    Procedures Procedures    Medications Ordered in ED Medications - No data to display  ED Course/ Medical Decision Making/ A&P                                 Medical Decision Making Amount and/or Complexity of Data Reviewed Radiology: ordered.   Patient presents with cough. Testing negative for influenza, COVID, RSV. Chest xray without acute findings. Findings consistent with COPD. Patient is a smoker.  Presentation not consistent with pneumonia, influenza, asthma. Treated with short course of prednisone and albuterol. Patient appears safe for discharge at this time. Care instructions and return precautions provided.        Final Clinical Impression(s) / ED Diagnoses Final diagnoses:  Acute cough    Rx / DC Orders ED Discharge Orders          Ordered    predniSONE (DELTASONE) 20 MG tablet        04/02/23 1425              Felicie Morn, NP 04/02/23 1448    Loetta Rough, MD 04/02/23 [phone]

## 2023-04-02 NOTE — ED Triage Notes (Signed)
 Pt arrived via [facility] from home c/o a cough since last Wednesday. Per [facility] report diminished lower lung sounds, O2 Sats 95% room air.

## 2023-10-19 ENCOUNTER — Emergency Department (HOSPITAL_COMMUNITY)

## 2023-10-19 ENCOUNTER — Other Ambulatory Visit: Payer: Self-pay

## 2023-10-19 ENCOUNTER — Encounter (HOSPITAL_COMMUNITY): Payer: Self-pay | Admitting: Emergency Medicine

## 2023-10-19 ENCOUNTER — Inpatient Hospital Stay (HOSPITAL_COMMUNITY)
Admission: EM | Admit: 2023-10-19 | Discharge: 2023-10-24 | DRG: 480 | Disposition: A | Discharge status: Home / Self Care | Attending: Internal Medicine | Admitting: Internal Medicine

## 2023-10-19 DIAGNOSIS — E860 Dehydration: Secondary | ICD-10-CM | POA: Diagnosis present

## 2023-10-19 DIAGNOSIS — E871 Hypo-osmolality and hyponatremia: Secondary | ICD-10-CM | POA: Diagnosis present

## 2023-10-19 DIAGNOSIS — S72102A Unspecified trochanteric fracture of left femur, initial encounter for closed fracture: Secondary | ICD-10-CM | POA: Diagnosis present

## 2023-10-19 DIAGNOSIS — I10 Essential (primary) hypertension: Secondary | ICD-10-CM | POA: Diagnosis not present

## 2023-10-19 DIAGNOSIS — D62 Acute posthemorrhagic anemia: Secondary | ICD-10-CM | POA: Diagnosis not present

## 2023-10-19 DIAGNOSIS — Z1152 Encounter for screening for COVID-19: Secondary | ICD-10-CM | POA: Diagnosis not present

## 2023-10-19 DIAGNOSIS — F17210 Nicotine dependence, cigarettes, uncomplicated: Secondary | ICD-10-CM | POA: Diagnosis present

## 2023-10-19 DIAGNOSIS — Y908 Blood alcohol level of 240 mg/100 ml or more: Secondary | ICD-10-CM | POA: Diagnosis present

## 2023-10-19 DIAGNOSIS — F10129 Alcohol abuse with intoxication, unspecified: Secondary | ICD-10-CM | POA: Diagnosis present

## 2023-10-19 DIAGNOSIS — Z79899 Other long term (current) drug therapy: Secondary | ICD-10-CM

## 2023-10-19 DIAGNOSIS — E872 Acidosis, unspecified: Secondary | ICD-10-CM | POA: Diagnosis present

## 2023-10-19 DIAGNOSIS — Z716 Tobacco abuse counseling: Secondary | ICD-10-CM | POA: Diagnosis not present

## 2023-10-19 DIAGNOSIS — Z8349 Family history of other endocrine, nutritional and metabolic diseases: Secondary | ICD-10-CM

## 2023-10-19 DIAGNOSIS — S72142A Displaced intertrochanteric fracture of left femur, initial encounter for closed fracture: Principal | ICD-10-CM | POA: Diagnosis present

## 2023-10-19 DIAGNOSIS — S51012A Laceration without foreign body of left elbow, initial encounter: Secondary | ICD-10-CM | POA: Diagnosis present

## 2023-10-19 DIAGNOSIS — Z681 Body mass index (BMI) 19 or less, adult: Secondary | ICD-10-CM | POA: Diagnosis not present

## 2023-10-19 DIAGNOSIS — E43 Unspecified severe protein-calorie malnutrition: Secondary | ICD-10-CM | POA: Diagnosis present

## 2023-10-19 DIAGNOSIS — S72002A Fracture of unspecified part of neck of left femur, initial encounter for closed fracture: Secondary | ICD-10-CM | POA: Diagnosis present

## 2023-10-19 DIAGNOSIS — K219 Gastro-esophageal reflux disease without esophagitis: Secondary | ICD-10-CM | POA: Diagnosis present

## 2023-10-19 DIAGNOSIS — S72102D Unspecified trochanteric fracture of left femur, subsequent encounter for closed fracture with routine healing: Secondary | ICD-10-CM | POA: Diagnosis not present

## 2023-10-19 DIAGNOSIS — W1789XA Other fall from one level to another, initial encounter: Secondary | ICD-10-CM | POA: Diagnosis present

## 2023-10-19 DIAGNOSIS — Z841 Family history of disorders of kidney and ureter: Secondary | ICD-10-CM | POA: Diagnosis not present

## 2023-10-19 DIAGNOSIS — F1010 Alcohol abuse, uncomplicated: Secondary | ICD-10-CM | POA: Diagnosis not present

## 2023-10-19 DIAGNOSIS — Y92008 Other place in unspecified non-institutional (private) residence as the place of occurrence of the external cause: Secondary | ICD-10-CM | POA: Diagnosis not present

## 2023-10-19 LAB — CBC WITH DIFFERENTIAL/PLATELET
Abs Immature Granulocytes: 0.03 K/uL (ref 0.00–0.07)
Basophils Absolute: 0.1 K/uL (ref 0.0–0.1)
Basophils Relative: 1 %
Eosinophils Absolute: 0.3 K/uL (ref 0.0–0.5)
Eosinophils Relative: 4 %
HCT: 36.9 % — ABNORMAL LOW (ref 39.0–52.0)
Hemoglobin: 12.9 g/dL — ABNORMAL LOW (ref 13.0–17.0)
Immature Granulocytes: 0 %
Lymphocytes Relative: 14 %
Lymphs Abs: 1.3 K/uL (ref 0.7–4.0)
MCH: 32.2 pg (ref 26.0–34.0)
MCHC: 35 g/dL (ref 30.0–36.0)
MCV: 92 fL (ref 80.0–100.0)
Monocytes Absolute: 0.4 K/uL (ref 0.1–1.0)
Monocytes Relative: 5 %
Neutro Abs: 6.6 K/uL (ref 1.7–7.7)
Neutrophils Relative %: 76 %
Platelets: 331 K/uL (ref 150–400)
RBC: 4.01 MIL/uL — ABNORMAL LOW (ref 4.22–5.81)
RDW: 14.5 % (ref 11.5–15.5)
WBC: 8.7 K/uL (ref 4.0–10.5)
nRBC: 0 % (ref 0.0–0.2)

## 2023-10-19 LAB — COMPREHENSIVE METABOLIC PANEL WITH GFR
ALT: 27 U/L (ref 0–44)
AST: 52 U/L — ABNORMAL HIGH (ref 15–41)
Albumin: 3.7 g/dL (ref 3.5–5.0)
Alkaline Phosphatase: 75 U/L (ref 38–126)
Anion gap: 13 (ref 5–15)
BUN: 8 mg/dL (ref 6–20)
CO2: 19 mmol/L — ABNORMAL LOW (ref 22–32)
Calcium: 8 mg/dL — ABNORMAL LOW (ref 8.9–10.3)
Chloride: 94 mmol/L — ABNORMAL LOW (ref 98–111)
Creatinine, Ser: 1.03 mg/dL (ref 0.61–1.24)
GFR, Estimated: >60 mL/min (ref >60)
Glucose, Bld: 102 mg/dL — ABNORMAL HIGH (ref 70–99)
Potassium: 3.9 mmol/L (ref 3.5–5.1)
Sodium: 126 mmol/L — ABNORMAL LOW (ref 135–145)
Total Bilirubin: 0.3 mg/dL (ref 0.0–1.2)
Total Protein: 7.5 g/dL (ref 6.5–8.1)

## 2023-10-19 LAB — ETHANOL: Alcohol, Ethyl (B): 368 mg/dL (ref <15)

## 2023-10-19 MED ORDER — PANTOPRAZOLE SODIUM 40 MG PO TBEC
40.0000 mg | DELAYED_RELEASE_TABLET | Freq: Every day | ORAL | Status: DC
Start: 2023-10-20 — End: 2023-10-24
  Administered 2023-10-20 – 2023-10-24 (×4): 40 mg via ORAL
  Filled 2023-10-19 (×4): qty 1

## 2023-10-19 MED ORDER — LIDOCAINE-EPINEPHRINE 2 %-1:100000 IJ SOLN: 20.0000 mL | Freq: Once | INTRAMUSCULAR | Status: DC

## 2023-10-19 MED ORDER — THIAMINE MONONITRATE 100 MG PO TABS
100.0000 mg | ORAL_TABLET | Freq: Every day | ORAL | Status: DC
  Administered 2023-10-20 – 2023-10-24 (×4): 100 mg via ORAL
  Filled 2023-10-19 (×4): qty 1

## 2023-10-19 MED ORDER — LORAZEPAM 1 MG PO TABS
1.0000 mg | ORAL_TABLET | ORAL | Status: DC | PRN
  Filled 2023-10-19: qty 1

## 2023-10-19 MED ORDER — LIDOCAINE-EPINEPHRINE (PF) 2 %-1:200000 IJ SOLN
INTRAMUSCULAR | Status: AC
  Administered 2023-10-19: 20 mL
  Filled 2023-10-19: qty 20

## 2023-10-19 MED ORDER — ACETAMINOPHEN 500 MG PO TABS
500.0000 mg | ORAL_TABLET | Freq: Three times a day (TID) | ORAL | Status: DC
  Administered 2023-10-20 – 2023-10-24 (×11): 500 mg via ORAL
  Filled 2023-10-19 (×12): qty 1

## 2023-10-19 MED ORDER — ENSURE PLUS HIGH PROTEIN PO LIQD
237.0000 mL | Freq: Two times a day (BID) | ORAL | Status: DC
  Administered 2023-10-23 – 2023-10-24 (×3): 237 mL via ORAL

## 2023-10-19 MED ORDER — CYCLOBENZAPRINE HCL 10 MG PO TABS
5.0000 mg | ORAL_TABLET | Freq: Three times a day (TID) | ORAL | Status: DC | PRN
  Administered 2023-10-20 – 2023-10-21 (×2): 5 mg via ORAL
  Filled 2023-10-19 (×2): qty 1

## 2023-10-19 MED ORDER — LIDOCAINE-EPINEPHRINE (PF) 1 %-1:200000 IJ SOLN: 20.0000 mL | Freq: Once | INTRAMUSCULAR | Status: DC

## 2023-10-19 MED ORDER — ONDANSETRON HCL 4 MG PO TABS: 4.0000 mg | ORAL_TABLET | Freq: Four times a day (QID) | ORAL | Status: DC | PRN

## 2023-10-19 MED ORDER — MORPHINE SULFATE (PF) 2 MG/ML IV SOLN
1.0000 mg | INTRAVENOUS | Status: DC | PRN
  Administered 2023-10-20: 1 mg via INTRAVENOUS
  Filled 2023-10-19: qty 1

## 2023-10-19 MED ORDER — DEXTROSE-SODIUM CHLORIDE 5-0.9 % IV SOLN
INTRAVENOUS | Status: AC
Start: 2023-10-20 — End: 2023-10-21

## 2023-10-19 MED ORDER — ENOXAPARIN SODIUM 40 MG/0.4ML IJ SOSY
40.0000 mg | PREFILLED_SYRINGE | INTRAMUSCULAR | Status: DC
  Administered 2023-10-20 – 2023-10-21 (×2): 40 mg via SUBCUTANEOUS
  Filled 2023-10-19 (×2): qty 0.4

## 2023-10-19 MED ORDER — ACETAMINOPHEN 325 MG PO TABS: 650.0000 mg | ORAL_TABLET | Freq: Four times a day (QID) | ORAL | Status: DC | PRN

## 2023-10-19 MED ORDER — ONDANSETRON HCL 4 MG/2ML IJ SOLN
4.0000 mg | Freq: Four times a day (QID) | INTRAMUSCULAR | Status: DC | PRN
  Administered 2023-10-20: 4 mg via INTRAVENOUS
  Filled 2023-10-19: qty 2

## 2023-10-19 MED ORDER — ACETAMINOPHEN 650 MG RE SUPP: 650.0000 mg | Freq: Four times a day (QID) | RECTAL | Status: DC | PRN

## 2023-10-19 MED ORDER — OXYCODONE HCL 5 MG PO TABS
5.0000 mg | ORAL_TABLET | ORAL | Status: DC | PRN
  Administered 2023-10-20 – 2023-10-24 (×5): 5 mg via ORAL
  Filled 2023-10-19 (×5): qty 1

## 2023-10-19 MED ORDER — ADULT MULTIVITAMIN W/MINERALS CH
1.0000 | ORAL_TABLET | Freq: Every day | ORAL | Status: DC
  Administered 2023-10-20 – 2023-10-24 (×4): 1 via ORAL
  Filled 2023-10-19 (×4): qty 1

## 2023-10-19 NOTE — Assessment & Plan Note (Signed)
 Comminuted left inter trochanteric fracture.   Orthopedics has been consulted and recommendation to keep NPO past midnight for possible surgical intervention. Plan to continue pain control with as needed oxycodone , and morphine .  Scheduled acetaminophen  and as needed muscle relaxant with cyclobenzaprine .  Follow up with orthopedics recommendation.

## 2023-10-19 NOTE — ED Notes (Signed)
 Pelvic binder removed per EDP order.

## 2023-10-19 NOTE — ED Notes (Signed)
 Pt sts he trip on steps after drinking tonight. Sts he drank 4 beers. Endorses everyday drinking. C/o left lower back pain with left hip pain that radiates into the lower leg. Presents with pelvic binder placed by EMS. Pt has laceration to left elbow. Dressing in place. Bleeding now controlled. Unknown last tetanus. Pt denies LOC. No head/neck pain. IV saline lock 22G right forearm.

## 2023-10-19 NOTE — Assessment & Plan Note (Addendum)
 Add as needed lorazepam  for now  In case of withdrawal symptoms plan to start CIWA protocol.  Neuro checks per unit protocol Add thiamine  and IV dextrose .   Alcohol and smoking cessation counseling.

## 2023-10-19 NOTE — H&P (Signed)
 History and Physical    Patient: Carl Evans FMW:[id number] DOB: 1970-06-16 DOA: 10/19/2023 DOS: the patient was seen and examined on 10/19/2023 PCP: Patient, No Pcp Per  Patient coming from: Home  Chief Complaint:  Chief Complaint  Patient presents with   Fall   Hip Pain   HPI: Carl Evans is a 53 y.o. male with medical history significant of alcohol and tobacco abuse who presented after a mechanical fall.  Apparently patient was at his usual state of health until today evening when he sustained and mechanical fall. He fell off his porch and landed over his left side, hitting his left hip and left elbow, no apparent head trauma or loss of consciousness.  Post fall patient had intense left hip pain and was not able to stand back on his feet. He had a left elbow laceration. EMS was called and he was brought to the ED.  He has been drinking alcohol, 4 beers today. Per his report he drinks alcohol every day.  At baseline he ambulates with no aid of walker or cain.    Review of Systems: As mentioned in the history of present illness. All other systems reviewed and are negative. Past Medical History:  Diagnosis Date   Alcohol abuse 06/20/2018   Tobacco use 06/20/2018   History reviewed. No pertinent surgical history. Social History:  reports that he has been smoking cigarettes. He has never used smokeless tobacco. He reports current alcohol use. He reports current drug use. Drug: Marijuana.  No Known Allergies  Family History  Problem Relation Age of Onset   Thyroid disease Mother    Kidney disease Father    Thyroid disease Sister     Prior to Admission medications   Medication Sig Start Date End Date Taking? Authorizing Provider  folic acid  (FOLVITE ) 1 MG tablet Take 1 tablet (1 mg total) by mouth daily. 06/24/18   Ricky Fines, MD  Multiple Vitamin (MULTIVITAMIN WITH MINERALS) TABS tablet Take 1 tablet by mouth daily. 06/24/18   Ricky Fines, MD  nicotine  (NICODERM CQ  -  DOSED IN MG/24 HOURS) 21 mg/24hr patch Place 1 patch (21 mg total) onto the skin daily. 06/24/18   Ricky Fines, MD  predniSONE  (DELTASONE ) 20 MG tablet 2 tabs po daily x 4 days, starting on Wednesday 04/03/23 04/02/23   Claudene Lenis, NP  saccharomyces boulardii (FLORASTOR) 250 MG capsule Take 1 capsule (250 mg total) by mouth 2 (two) times daily. 06/23/18   Ricky Fines, MD  thiamine  100 MG tablet Take 1 tablet (100 mg total) by mouth daily. 06/24/18   Ricky Fines, MD    Physical Exam: Vitals:   10/19/23 2115 10/19/23 2130 10/19/23 2145 10/19/23 2200  BP: 121/79 117/83 114/88 113/67  Pulse: 78 83 78 89  Resp:    15  Temp:      TempSrc:      SpO2: 94% 93% 94% 96%  Weight:      Height:       BP 113/67   Pulse 89   Temp 98.1 F (36.7 C) (Oral)   Resp 15   Ht 5' 8 (1.727 m)   Wt 54.4 kg   SpO2 96%   BMI 18.25 kg/m   Neurology awake and alert. Deconditioned.  ENT with mild pallor with no icterus Cardiovascular with S1 and S2 present and regular with no gallops, rubs or murmurs Respiratory with no rales or wheezing, no rhonchi  Abdomen with no distention and non tender No lower extremity  edema Left lower extremity shortened and internally rotated. Left elbow laceration that has been sutured.  Data Reviewed:   Na 126 K 3.9 CL 94 bicarbonate 19 glucose 102 bun 8 cr 1,0  AST 52 ALT 27  Wbc 8,7 hgb 12.9 plt 331   Sars COVID 19 negative Influenza negative RSV negative   Alcohol level 368  Left elbow radiograph with no acute abnormality noted Left hip radiograph with comminuted intra-trochanteric fracture of the left femur.    Assessment and Plan: * Closed left hip fracture, initial encounter (HCC) Comminuted left inter trochanteric fracture.   Orthopedics has been consulted and recommendation to keep NPO past midnight for possible surgical intervention. Plan to continue pain control with as needed oxycodone , and morphine .  Scheduled acetaminophen  and as needed  muscle relaxant with cyclobenzaprine .  Follow up with orthopedics recommendation.   Hyponatremia Likely related to acute alcohol intoxication.  Non anion gap metabolic acidosis.   Plan to continue close monitoring of Na and renal function Add dextrose  with normal saline at 75 ml per hr Regular diet and nutritional supplements.  Nutrition consultation.   Alcohol abuse Add as needed lorazepam  for now  In case of withdrawal symptoms plan to start CIWA protocol.  Neuro checks per unit protocol Add thiamine  and IV dextrose .   Alcohol and smoking cessation counseling.        Advance Care Planning:   Code Status: Full Code   Consults: Orthopedics over the phone with recommendation for NPO past midnight in preparation for possible surgical intervention   Family Communication: no family at the bedside   Severity of Illness: The appropriate patient status for this patient is INPATIENT. Inpatient status is judged to be reasonable and necessary in order to provide the required intensity of service to ensure the patient's safety. The patient's presenting symptoms, physical exam findings, and initial radiographic and laboratory data in the context of their chronic comorbidities is felt to place them at high risk for further clinical deterioration. Furthermore, it is not anticipated that the patient will be medically stable for discharge from the hospital within 2 midnights of admission.   * I certify that at the point of admission it is my clinical judgment that the patient will require inpatient hospital care spanning beyond 2 midnights from the point of admission due to high intensity of service, high risk for further deterioration and high frequency of surveillance required.*  Author: Elidia Toribio Furnace, MD 10/19/2023 10:37 PM  For on call review [email or web address].

## 2023-10-19 NOTE — ED Triage Notes (Signed)
 Pt bib EMS after he fell off porch (after drinking tonight) and has c/o L hip pain that radiates down L leg. Pt also c/o pain to lower back. Pt with skin tear to L elbow currently wrapped by EMS.

## 2023-10-19 NOTE — ED Provider Notes (Signed)
 [place] EMERGENCY DEPARTMENT AT [facility] Provider Note   CSN: [id number] Arrival date & time: 10/19/23  2059     Patient presents with: Fall and Hip Pain   Carl Evans is a 53 y.o. male.   Patient has a history of EtOH abuse and smoking.  Patient fell on his left elbow and his left hip.  The history is provided by the patient and medical records. No language interpreter was used.  Fall This is a new problem. The current episode started 6 to 12 hours ago. The problem occurs constantly. The problem has not changed since onset.Pertinent negatives include no chest pain, no abdominal pain and no headaches. Nothing aggravates the symptoms. Nothing relieves the symptoms. He has tried nothing for the symptoms.  Hip Pain Pertinent negatives include no chest pain, no abdominal pain and no headaches.       Prior to Admission medications   Medication Sig Start Date End Date Taking? Authorizing Provider  folic acid  (FOLVITE ) 1 MG tablet Take 1 tablet (1 mg total) by mouth daily. 06/24/18   Ricky Fines, MD  Multiple Vitamin (MULTIVITAMIN WITH MINERALS) TABS tablet Take 1 tablet by mouth daily. 06/24/18   Ricky Fines, MD  nicotine  (NICODERM CQ  - DOSED IN MG/24 HOURS) 21 mg/24hr patch Place 1 patch (21 mg total) onto the skin daily. 06/24/18   Ricky Fines, MD  predniSONE  (DELTASONE ) 20 MG tablet 2 tabs po daily x 4 days, starting on Wednesday 04/03/23 04/02/23   Claudene Lenis, NP  saccharomyces boulardii (FLORASTOR) 250 MG capsule Take 1 capsule (250 mg total) by mouth 2 (two) times daily. 06/23/18   Ricky Fines, MD  thiamine  100 MG tablet Take 1 tablet (100 mg total) by mouth daily. 06/24/18   Ricky Fines, MD    Allergies: Patient has no known allergies.    Review of Systems  Constitutional:  Negative for appetite change and fatigue.  HENT:  Negative for congestion, ear discharge and sinus pressure.   Eyes:  Negative for discharge.  Respiratory:  Negative for cough.    Cardiovascular:  Negative for chest pain.  Gastrointestinal:  Negative for abdominal pain and diarrhea.  Genitourinary:  Negative for frequency and hematuria.  Musculoskeletal:  Negative for back pain.       Left hip and left elbow pain  Skin:  Negative for rash.  Neurological:  Negative for seizures and headaches.  Psychiatric/Behavioral:  Negative for hallucinations.     Updated Vital Signs BP 113/67   Pulse 89   Temp 98.1 F (36.7 C) (Oral)   Resp 15   Ht 5' 8 (1.727 m)   Wt 54.4 kg   SpO2 96%   BMI 18.25 kg/m   Physical Exam Vitals and nursing note reviewed.  Constitutional:      Appearance: He is well-developed.  HENT:     Head: Normocephalic.     Nose: Nose normal.  Eyes:     General: No scleral icterus.    Conjunctiva/sclera: Conjunctivae normal.  Neck:     Thyroid: No thyromegaly.  Cardiovascular:     Rate and Rhythm: Normal rate and regular rhythm.     Heart sounds: No murmur heard.    No friction rub. No gallop.  Pulmonary:     Breath sounds: No stridor. No wheezing or rales.  Chest:     Chest wall: No tenderness.  Abdominal:     General: There is no distension.     Tenderness: There is no  abdominal tenderness. There is no rebound.  Musculoskeletal:     Cervical back: Neck supple.     Comments: Tenderness to left elbow with 3 cm laceration and tenderness to left hip  Lymphadenopathy:     Cervical: No cervical adenopathy.  Skin:    Findings: No erythema or rash.  Neurological:     Mental Status: He is alert and oriented to person, place, and time.     Motor: No abnormal muscle tone.     Coordination: Coordination normal.  Psychiatric:        Behavior: Behavior normal.     (all labs ordered are listed, but only abnormal results are displayed) Labs Reviewed  CBC WITH DIFFERENTIAL/PLATELET - Abnormal; Notable for the following components:      Result Value   RBC 4.01 (*)    Hemoglobin 12.9 (*)    HCT 36.9 (*)    All other components within  normal limits  COMPREHENSIVE METABOLIC PANEL WITH GFR - Abnormal; Notable for the following components:   Sodium 126 (*)    Chloride 94 (*)    CO2 19 (*)    Glucose, Bld 102 (*)    Calcium 8.0 (*)    AST 52 (*)    All other components within normal limits  ETHANOL - Abnormal; Notable for the following components:   Alcohol, Ethyl (B) 368 (*)    All other components within normal limits    EKG: None  Radiology: DG Elbow Complete Left Result Date: 10/19/2023 CLINICAL DATA:  Recent fall with elbow pain, initial encounter EXAM: LEFT ELBOW - COMPLETE 3+ VIEW COMPARISON:  None Available. FINDINGS: There is no evidence of fracture, dislocation, or joint effusion. There is no evidence of arthropathy or other focal bone abnormality. Soft tissues are unremarkable. IMPRESSION: No acute abnormality noted. Electronically Signed   By: Oneil Devonshire M.D.   On: 10/19/2023 21:43   DG Hip Unilat With Pelvis 2-3 Views Left Result Date: 10/19/2023 CLINICAL DATA:  Recent fall with left hip pain, initial encounter EXAM: DG HIP (WITH OR WITHOUT PELVIS) 3V LEFT COMPARISON:  None Available. FINDINGS: Comminuted left intratrochanteric fracture is noted with impaction and angulation at the fracture site. No other fractures are seen. No soft tissue abnormality is noted. IMPRESSION: Comminuted intratrochanteric fracture of the left femur. Electronically Signed   By: Oneil Devonshire M.D.   On: 10/19/2023 21:42     .Laceration Repair  Date/Time: 10/19/2023 10:42 PM  Performed by: Suzette Pac, MD Authorized by: Suzette Pac, MD   Laceration details:    Length (cm):  3 Comments:     Patient had a 3 cm laceration to his left elbow.  The area was cleaned thoroughly with Betadine  and sterile water.  Lidocaine  with epi was used to anesthetize the area and 5, 4-0 nylon sutures were used to close laceration.  The patient tolerated the procedure well    Medications Ordered in the ED  lidocaine -EPINEPHrine  (PF)  (XYLOCAINE -EPINEPHrine ) 1 %-1:200000 (PF) injection 20 mL (0 mLs Infiltration Hold 10/19/23 2213)  lidocaine -EPINEPHrine  (XYLOCAINE  W/EPI) 2 %-1:200000 (PF) injection (20 mLs  Given 10/19/23 2213)  Patient with left hip fracture.  I spoke with Dr. Margrette, orthopedic surgeon and he would like the patient to be admitted to medicine with n.p.o. after midnight and he will see the patient in the morning.   Patient had a 3 cm laceration to his left elbow that was sutured with 5, 4-0 nylon sutures  Medical Decision Making Amount and/or Complexity of Data Reviewed Labs: ordered. Radiology: ordered.  Risk Prescription drug management. Decision regarding hospitalization.   Left hip fracture and laceration to left elbow with contusion to left elbow     Final diagnoses:  Closed fracture of left hip, initial encounter [facility])    ED Discharge Orders     None          Suzette Pac, MD 10/21/23 1307

## 2023-10-19 NOTE — Assessment & Plan Note (Signed)
 Likely related to acute alcohol intoxication.  Non anion gap metabolic acidosis.   Plan to continue close monitoring of Na and renal function Add dextrose  with normal saline at 75 ml per hr Regular diet and nutritional supplements.  Nutrition consultation.

## 2023-10-19 NOTE — ED Notes (Signed)
 Date and time results received: 10/19/23 1003 (use smartphrase .now to insert current time)  Test: alcohol Critical Value: 368  Name of Provider Notified: Zammit  Orders Received? Or Actions Taken?: Orders Received - See Orders for details

## 2023-10-20 DIAGNOSIS — F1010 Alcohol abuse, uncomplicated: Secondary | ICD-10-CM | POA: Diagnosis not present

## 2023-10-20 DIAGNOSIS — E871 Hypo-osmolality and hyponatremia: Secondary | ICD-10-CM | POA: Diagnosis not present

## 2023-10-20 DIAGNOSIS — S72002A Fracture of unspecified part of neck of left femur, initial encounter for closed fracture: Secondary | ICD-10-CM | POA: Diagnosis not present

## 2023-10-20 LAB — BASIC METABOLIC PANEL WITH GFR
Anion gap: 13 (ref 5–15)
BUN: 8 mg/dL (ref 6–20)
CO2: 19 mmol/L — ABNORMAL LOW (ref 22–32)
Calcium: 7.6 mg/dL — ABNORMAL LOW (ref 8.9–10.3)
Chloride: 95 mmol/L — ABNORMAL LOW (ref 98–111)
Creatinine, Ser: 0.86 mg/dL (ref 0.61–1.24)
GFR, Estimated: >60 mL/min (ref >60)
Glucose, Bld: 119 mg/dL — ABNORMAL HIGH (ref 70–99)
Potassium: 4.1 mmol/L (ref 3.5–5.1)
Sodium: 127 mmol/L — ABNORMAL LOW (ref 135–145)

## 2023-10-20 LAB — HIV ANTIBODY (ROUTINE TESTING W REFLEX): HIV Screen 4th Generation wRfx: NONREACTIVE

## 2023-10-20 LAB — CBC
HCT: 32.2 % — ABNORMAL LOW (ref 39.0–52.0)
Hemoglobin: 10.9 g/dL — ABNORMAL LOW (ref 13.0–17.0)
MCH: 30.9 pg (ref 26.0–34.0)
MCHC: 33.9 g/dL (ref 30.0–36.0)
MCV: 91.2 fL (ref 80.0–100.0)
Platelets: 303 K/uL (ref 150–400)
RBC: 3.53 MIL/uL — ABNORMAL LOW (ref 4.22–5.81)
RDW: 14.3 % (ref 11.5–15.5)
WBC: 12 K/uL — ABNORMAL HIGH (ref 4.0–10.5)
nRBC: 0 % (ref 0.0–0.2)

## 2023-10-20 MED ORDER — FOLIC ACID 1 MG PO TABS
1.0000 mg | ORAL_TABLET | Freq: Every day | ORAL | Status: DC
  Administered 2023-10-20 – 2023-10-24 (×4): 1 mg via ORAL
  Filled 2023-10-20 (×4): qty 1

## 2023-10-20 MED ORDER — LORAZEPAM 2 MG/ML IJ SOLN
1.0000 mg | INTRAMUSCULAR | Status: AC | PRN
  Administered 2023-10-20: 2 mg via INTRAVENOUS
  Administered 2023-10-20: 1 mg via INTRAVENOUS
  Administered 2023-10-20: 2 mg via INTRAVENOUS
  Filled 2023-10-20 (×3): qty 1

## 2023-10-20 MED ORDER — CHLORHEXIDINE GLUCONATE CLOTH 2 % EX PADS
6.0000 | MEDICATED_PAD | Freq: Every day | CUTANEOUS | Status: DC
  Administered 2023-10-20 – 2023-10-24 (×5): 6 via TOPICAL

## 2023-10-20 MED ORDER — MORPHINE SULFATE (PF) 2 MG/ML IV SOLN
1.0000 mg | INTRAVENOUS | Status: DC | PRN
  Administered 2023-10-21 – 2023-10-24 (×5): 1 mg via INTRAVENOUS
  Filled 2023-10-20 (×6): qty 1

## 2023-10-20 MED ORDER — PROCHLORPERAZINE EDISYLATE 10 MG/2ML IJ SOLN
10.0000 mg | Freq: Four times a day (QID) | INTRAMUSCULAR | Status: DC | PRN
  Administered 2023-10-20: 10 mg via INTRAVENOUS
  Filled 2023-10-20: qty 2

## 2023-10-20 MED ORDER — THIAMINE MONONITRATE 100 MG PO TABS: 100.0000 mg | ORAL_TABLET | Freq: Every day | ORAL | Status: DC

## 2023-10-20 MED ORDER — ADULT MULTIVITAMIN W/MINERALS CH: 1.0000 | ORAL_TABLET | Freq: Every day | ORAL | Status: DC

## 2023-10-20 MED ORDER — LORAZEPAM 1 MG PO TABS: 1.0000 mg | ORAL_TABLET | ORAL | Status: AC | PRN

## 2023-10-20 MED ORDER — THIAMINE HCL 100 MG/ML IJ SOLN
100.0000 mg | Freq: Every day | INTRAMUSCULAR | Status: DC
Start: 2023-10-20 — End: 2023-10-20

## 2023-10-20 NOTE — Progress Notes (Signed)
 Pt arrived to Rm 306 via stretcher and was moved into bed by staff. A&O x 4. Family at bedside and went home. PRN pain medication given by RN.

## 2023-10-20 NOTE — Progress Notes (Signed)
 Subjective: Pain left hip   Objective: Vital signs in last 24 hours: Temp:  [97.6 F (36.4 C)-98.3 F (36.8 C)] 98.1 F (36.7 C) (09/14 0656) Pulse Rate:  [74-90] 89 (09/14 0757) Resp:  [14-20] 16 (09/14 0656) BP: (106-127)/(67-106) 116/74 (09/14 0757) SpO2:  [93 %-100 %] 98 % (09/14 0757) Weight:  [45.3 kg-54.4 kg] 45.3 kg (09/13 2355)  Intake/Output from previous day: No intake/output data recorded. Intake/Output this shift: No intake/output data recorded.  Recent Labs    10/19/23 2140 10/20/23 0452  HGB 12.9* 10.9*   Recent Labs    10/19/23 2140 10/20/23 0452  WBC 8.7 12.0*  RBC 4.01* 3.53*  HCT 36.9* 32.2*  PLT 331 303   Recent Labs    10/19/23 2140 10/20/23 0452  NA 126* 127*  K 3.9 4.1  CL 94* 95*  CO2 19* 19*  BUN 8 8  CREATININE 1.03 0.86  GLUCOSE 102* 119*  CALCIUM 8.0* 7.6*   No results for input(s): LABPT, INR in the last 72 hours.  The patient is obtunded.  He refuses to wake up.  He wakes up and then goes right back to sleep.  He has pain and tenderness over his left proximal femur consistent with his hip fracture    Assessment/Plan: Tuning up required prior to surgery: Including sodium which is low, addressing the nausea and vomiting, hopefully he will wake up so he can be counseled about his surgical options and risk and complications  Plan for left hip internal fixation when medically appropriate and scheduling allows   Carl Evans 10/20/2023, 9:18 AM

## 2023-10-20 NOTE — Consult Note (Addendum)
 Reason for Consult: Fracture left hip Referring Physician: Eric Nunnery MD  Carl Evans is an 53 y.o. male.  HPI: 53 year old male chronic alcoholic was inebriated and fell and fractured his left hip.  Patient presented with coffee grounds vomiting during his hospital stay.  His alcohol level was above 300 and he was hyponatremic.  Patient was admitted for hip fracture and correction of electrolyte issues and vomiting.  His vomiting is settled down his electrolytes have improved he presents now for surgical repair of his left hip fracture  Patient complains of pain and inability to ambulate.  Pain was located over the left hip.  Patient came in on Saturday the 13th  Past Medical History:  Diagnosis Date   Alcohol abuse 06/20/2018   Tobacco use 06/20/2018    History reviewed. No pertinent surgical history.  Family History  Problem Relation Age of Onset   Thyroid disease Mother    Kidney disease Father    Thyroid disease Sister     Social History:  reports that he has been smoking cigarettes. He has never used smokeless tobacco. He reports current alcohol use. He reports current drug use. Drug: Marijuana.  Allergies: No Known Allergies  Medications: I have reviewed the patient's current medications.  Results for orders placed or performed during the hospital encounter of 10/19/23 (from the past 48 hours)  CBC with Differential     Status: Abnormal   Collection Time: 10/19/23  9:40 PM  Result Value Ref Range   WBC 8.7 4.0 - 10.5 K/uL   RBC 4.01 (L) 4.22 - 5.81 MIL/uL   Hemoglobin 12.9 (L) 13.0 - 17.0 g/dL   HCT 63.0 (L) 60.9 - 47.9 %   MCV 92.0 80.0 - 100.0 fL   MCH 32.2 26.0 - 34.0 pg   MCHC 35.0 30.0 - 36.0 g/dL   RDW 85.4 88.4 - 84.4 %   Platelets 331 150 - 400 K/uL   nRBC 0.0 0.0 - 0.2 %   Neutrophils Relative % 76 %   Neutro Abs 6.6 1.7 - 7.7 K/uL   Lymphocytes Relative 14 %   Lymphs Abs 1.3 0.7 - 4.0 K/uL   Monocytes Relative 5 %   Monocytes Absolute 0.4 0.1 -  1.0 K/uL   Eosinophils Relative 4 %   Eosinophils Absolute 0.3 0.0 - 0.5 K/uL   Basophils Relative 1 %   Basophils Absolute 0.1 0.0 - 0.1 K/uL   Immature Granulocytes 0 %   Abs Immature Granulocytes 0.03 0.00 - 0.07 K/uL    Comment: Performed at [facility], [address] [place], [place] [address]  Comprehensive metabolic panel     Status: Abnormal   Collection Time: 10/19/23  9:40 PM  Result Value Ref Range   Sodium 126 (L) 135 - 145 mmol/L   Potassium 3.9 3.5 - 5.1 mmol/L   Chloride 94 (L) 98 - 111 mmol/L   CO2 19 (L) 22 - 32 mmol/L   Glucose, Bld 102 (H) 70 - 99 mg/dL    Comment: Glucose reference range applies only to samples taken after fasting for at least 8 hours.   BUN 8 6 - 20 mg/dL   Creatinine, Ser 8.96 0.61 - 1.24 mg/dL   Calcium 8.0 (L) 8.9 - 10.3 mg/dL   Total Protein 7.5 6.5 - 8.1 g/dL   Albumin 3.7 3.5 - 5.0 g/dL   AST 52 (H) 15 - 41 U/L   ALT 27 0 - 44 U/L   Alkaline Phosphatase 75 38 -  126 U/L   Total Bilirubin 0.3 0.0 - 1.2 mg/dL   GFR, Estimated >39 >39 mL/min    Comment: (NOTE) Calculated using the CKD-EPI Creatinine Equation (2021)    Anion gap 13 5 - 15    Comment: Performed at [facility], [address] [place], [place] [address]  Ethanol     Status: Abnormal   Collection Time: 10/19/23  9:40 PM  Result Value Ref Range   Alcohol, Ethyl (B) 368 (HH) <15 mg/dL    Comment: CRITICAL RESULT CALLED TO, READ BACK BY AND VERIFIED WITH EDWARDS,K @ 2204 ON 10/19/23 BY JUW (NOTE) For medical purposes only. Performed at [facility], [address], [place], [place] [address]   Basic metabolic panel     Status: Abnormal   Collection Time: 10/20/23  4:52 AM  Result Value Ref Range   Sodium 127 (L) 135 - 145 mmol/L   Potassium 4.1 3.5 - 5.1 mmol/L   Chloride 95 (L) 98 - 111 mmol/L   CO2 19 (L) 22 - 32 mmol/L   Glucose, Bld 119 (H) 70 - 99 mg/dL    Comment: Glucose reference range applies only to samples taken after fasting for at least 8 hours.   BUN 8 6  - 20 mg/dL   Creatinine, Ser 9.13 0.61 - 1.24 mg/dL   Calcium 7.6 (L) 8.9 - 10.3 mg/dL   GFR, Estimated >39 >39 mL/min    Comment: (NOTE) Calculated using the CKD-EPI Creatinine Equation (2021)    Anion gap 13 5 - 15    Comment: Performed at [facility], [address], [place], [place] [address]  CBC     Status: Abnormal   Collection Time: 10/20/23  4:52 AM  Result Value Ref Range   WBC 12.0 (H) 4.0 - 10.5 K/uL   RBC 3.53 (L) 4.22 - 5.81 MIL/uL   Hemoglobin 10.9 (L) 13.0 - 17.0 g/dL   HCT 67.7 (L) 60.9 - 47.9 %   MCV 91.2 80.0 - 100.0 fL   MCH 30.9 26.0 - 34.0 pg   MCHC 33.9 30.0 - 36.0 g/dL   RDW 85.6 88.4 - 84.4 %   Platelets 303 150 - 400 K/uL   nRBC 0.0 0.0 - 0.2 %    Comment: Performed at [facility], [address] [place], [place] [address]    DG Elbow Complete Left Result Date: 10/19/2023 CLINICAL DATA:  Recent fall with elbow pain, initial encounter EXAM: LEFT ELBOW - COMPLETE 3+ VIEW COMPARISON:  None Available. FINDINGS: There is no evidence of fracture, dislocation, or joint effusion. There is no evidence of arthropathy or other focal bone abnormality. Soft tissues are unremarkable. IMPRESSION: No acute abnormality noted. Electronically Signed   By: Oneil Devonshire M.D.   On: 10/19/2023 21:43   DG Hip Unilat With Pelvis 2-3 Views Left Result Date: 10/19/2023 CLINICAL DATA:  Recent fall with left hip pain, initial encounter EXAM: DG HIP (WITH OR WITHOUT PELVIS) 3V LEFT COMPARISON:  None Available. FINDINGS: Comminuted left intratrochanteric fracture is noted with impaction and angulation at the fracture site. No other fractures are seen. No soft tissue abnormality is noted. IMPRESSION: Comminuted intratrochanteric fracture of the left femur. Electronically Signed   By: Oneil Devonshire M.D.   On: 10/19/2023 21:42    Review of Systems patient was inebriated and even when he became lucent his review of systems was unremarkable and negative Blood pressure 116/74, pulse 89,  temperature 98.1 F (36.7 C), temperature source Axillary, resp. rate 16,  height 5' 8 (1.727 m), weight 45.3 kg, SpO2 98%. Physical Exam  General Appearance definitely underweight.  Small frame thin low body fat.  Mental status.  Patient awake alert on secondary evaluation on Monday.  Initially inebriated and could not answer questions or stay awake long enough review his situation with me  Mood pleasant affect flat  Cardiovascular exam normal pulse perfusion capillary refill  Neurologically sensation normal in all 4 extremities  Skin over the fracture site normal no lacerations  Musculoskeletal left hip tenderness over the fracture site pain response with range of motion shortened externally rotated slightly Muscle tone normal Range of motion limited by pain ankle joint and knee joint seem to be reduced and stable and x-ray shows hip joint reduced  His other extremities show no contracture subluxation atrophy or tremor he has multiple skin areas that are abnormal but not cellulitic or open   Assessment/Plan: Imaging studies were reviewed.  The patient has a nondisplaced fracture of his left intertrochanteric region.  Recommend intramedullary fixation with cephalomedullary nailing left hip   The procedure has been fully reviewed with the patient; The risks and benefits of surgery have been discussed and explained and understood. Alternative treatment has also been reviewed, questions were encouraged and answered. The postoperative plan is also been reviewed.    Carl Evans 10/20/2023, 9:18 AM

## 2023-10-20 NOTE — Progress Notes (Signed)
 Pt vomited brownish liquid x 3. On call MD notified. New orders: CIWA protocol, IV ativan . IV zofran  and IV ativan  given and CIWA protocol begun.

## 2023-10-20 NOTE — Progress Notes (Signed)
  Progress Note   Patient: Carl Evans FMW:[id number] DOB: 09-14-1970 DOA: 10/19/2023     1 DOS: the patient was seen and examined on 10/20/2023   Brief hospital admission narrative: As per H&P written by Dr. Noralee on 10/19/2023 Carl Evans is a 53 y.o. male with medical history significant of alcohol and tobacco abuse who presented after a mechanical fall.  Apparently patient was at his usual state of health until today evening when he sustained and mechanical fall. He fell off his porch and landed over his left side, hitting his left hip and left elbow, no apparent head trauma or loss of consciousness.  Post fall patient had intense left hip pain and was not able to stand back on his feet. He had a left elbow laceration. EMS was called and he was brought to the ED.  He has been drinking alcohol, 4 beers today. Per his report he drinks alcohol every day.  At baseline he ambulates with no aid of walker or cain.   Assessment and plan Closed left hip fracture, initial encounter (HCC) -Continue as needed analgesics - Follow further evaluation and recommendation by orthopedic service. - Continue clinical optimization.  Hyponatremia -In the setting of alcohol abuse - Possible component of mild dehydration - Will continue judicious fluid resuscitation - Follow electrolytes trend.  Alcohol abuse -No major withdrawal symptoms currently appreciated - Continue CIWA protocol - Thiamine  and folic acid  has been ordered - Cessation counseling provided.  Severe protein calorie malnutrition Body mass index is 15.19 kg/m.  - Dietitian service on board - Continue multivitamins and feeding supplements.  Nausea/vomiting and GERD - Continue PPI - Continue as needed antiemetics   Subjective:  Somnolent; able to follow commands intermittently.  No chest pain, no nausea and no vomiting currently.  Complaining of left hip pain.  Physical Exam: Vitals:   10/20/23 0548 10/20/23 0656 10/20/23  0757 10/20/23 1236  BP: 110/72 106/78 116/74 128/76  Pulse: 90  89 (!) 105  Resp: 16 16  20   Temp: 98.3 F (36.8 C) 98.1 F (36.7 C)  97.9 F (36.6 C)  TempSrc: Oral Axillary  Axillary  SpO2: 100% 96% 98% 99%  Weight:      Height:       General exam: Sleepy; intermittently following commands.  No chest pain, no shortness of breath, in no major distress.  Reports pain in his left hip area. Respiratory system: Good saturation on room air. Cardiovascular system:RRR. No murmurs, rubs, gallops. Gastrointestinal system: Abdomen is nondistended, soft and nontender. No organomegaly or masses felt. Normal bowel sounds heard. Central nervous system: Alert and oriented. No focal neurological deficits. Extremities: No cyanosis or clubbing; decreased range of motion and pain on his left lower extremity appreciated. Skin: No petechiae. Psychiatry: Judgement and insight appear normal. Mood & affect appropriate.    Data Reviewed: Basic metabolic panel: Sodium 127, potassium 4.1, chloride 95, bicarb 19, BUN 8, creatinine 0.86 and GFR >60 CBC: WBC 2.0, hemoglobin 10.9 platelet count 303K  Family Communication: No family at bedside.  Disposition: Status is: Inpatient Remains inpatient appropriate because: Continue IV therapy.   Time spent: 50 minutes  Author: Eric Nunnery, MD 10/20/2023 5:48 PM  For on call review [email or web address].

## 2023-10-21 DIAGNOSIS — E871 Hypo-osmolality and hyponatremia: Secondary | ICD-10-CM | POA: Diagnosis not present

## 2023-10-21 DIAGNOSIS — S72002A Fracture of unspecified part of neck of left femur, initial encounter for closed fracture: Secondary | ICD-10-CM | POA: Diagnosis not present

## 2023-10-21 DIAGNOSIS — F1010 Alcohol abuse, uncomplicated: Secondary | ICD-10-CM | POA: Diagnosis not present

## 2023-10-21 LAB — BASIC METABOLIC PANEL WITH GFR
Anion gap: 12 (ref 5–15)
BUN: 9 mg/dL (ref 6–20)
CO2: 22 mmol/L (ref 22–32)
Calcium: 7.7 mg/dL — ABNORMAL LOW (ref 8.9–10.3)
Chloride: 100 mmol/L (ref 98–111)
Creatinine, Ser: 0.89 mg/dL (ref 0.61–1.24)
GFR, Estimated: >60 mL/min (ref >60)
Glucose, Bld: 133 mg/dL — ABNORMAL HIGH (ref 70–99)
Potassium: 3.8 mmol/L (ref 3.5–5.1)
Sodium: 134 mmol/L — ABNORMAL LOW (ref 135–145)

## 2023-10-21 NOTE — Progress Notes (Signed)
 Initial Nutrition Assessment  DOCUMENTATION CODES:   Underweight  INTERVENTION:   When diet advanced, monitor magnesium , potassium, and phosphorus daily for at least 3 days, MD to replete as needed, as pt is at risk for refeeding syndrome given suspected malnutrition with recent weight loss, underweight status, and hx ETOH abuse.  Continue MVI with minerals and thiamine . When diet advanced, offer Ensure Plus High Protein po TID, each supplement provides 350 kcal and 20 grams of protein. When diet advanced, offer Magic cup TID with meals, each supplement provides 290 kcal and 9 grams of protein.  NUTRITION DIAGNOSIS:   Increased nutrient needs related to hip fracture, post-op healing as evidenced by estimated needs.  GOAL:   Patient will meet greater than or equal to 90% of their needs  MONITOR:   Diet advancement, PO intake, Supplement acceptance  REASON FOR ASSESSMENT:   Consult Assessment of nutrition requirement/status  ASSESSMENT:   53 yo male admitted with L hip fracture S/P fall, hyponatremia. PMH includes alcohol abuse, tobacco use.  Unable to speak with patient or complete NFPE at this time. Patient is currently NPO. Plans for L hip internal fixation when medically appropriate and scheduling allows.   Labs reviewed.  Na 126-->127-->134 Calcium 7.7 Tox blood screen for alcohol 368 on admission  Medications reviewed and include folic acid , MVI with  minerals, protonix , thiamine .   Weight history reviewed. Patient with 17% weight loss over the past 6.5 months. He is underweight with BMI=15.2. Suspect patient is malnourished given hx of ETOH with underweight status and recent weight loss. Unable to obtain enough information at this time for identification of malnutrition.   NUTRITION - FOCUSED PHYSICAL EXAM:  Unable to complete  Diet Order:   Diet Order             Diet NPO time specified Except for: Sips with Meds  Diet effective midnight                    EDUCATION NEEDS:   Not appropriate for education at this time  Skin:  Skin Assessment: Reviewed RN Assessment  Last BM:  9/13  Height:   Ht Readings from Last 1 Encounters:  10/19/23 5' 8 (1.727 m)    Weight:   Wt Readings from Last 1 Encounters:  10/19/23 45.3 kg    Ideal Body Weight:  70 kg  BMI:  Body mass index is 15.19 kg/m.  Estimated Nutritional Needs:   Kcal:  1600-1800  Protein:  75-90 gm  Fluid:  1.6-1.8 L   Suzen HUNT RD, LDN, CNSC Contact via secure chat. If unavailable, use group chat RD Inpatient.

## 2023-10-21 NOTE — Discharge Instructions (Signed)

## 2023-10-21 NOTE — Progress Notes (Signed)
  Progress Note   Patient: Carl Evans FMW:[id number] DOB: 08/03/1970 DOA: 10/19/2023     2 DOS: the patient was seen and examined on 10/21/2023   Brief hospital admission narrative: As per H&P written by Dr. Noralee on 10/19/2023 Carl Evans is a 53 y.o. male with medical history significant of alcohol and tobacco abuse who presented after a mechanical fall.  Apparently patient was at his usual state of health until today evening when he sustained and mechanical fall. He fell off his porch and landed over his left side, hitting his left hip and left elbow, no apparent head trauma or loss of consciousness.  Post fall patient had intense left hip pain and was not able to stand back on his feet. He had a left elbow laceration. EMS was called and he was brought to the ED.  He has been drinking alcohol, 4 beers today. Per his report he drinks alcohol every day.  At baseline he ambulates with no aid of walker or cain.   Assessment and plan Closed left hip fracture, initial encounter (HCC) -Continue as needed analgesics - Appreciate assistance and recommendation by orthopedic service. - After clinical optimization plan is for surgical repair on 10/22/2023.  Hyponatremia -In the setting of alcohol abuse - Possible component of mild dehydration - Continue to maintain adequate hydration and follow electrolytes trend.  Alcohol abuse -No major withdrawal symptoms currently appreciated - Continue CIWA protocol - Thiamine  and folic acid  has been ordered - Cessation counseling provided.  Severe protein calorie malnutrition Body mass index is 15.19 kg/m.  - Dietitian service on board - Continue multivitamins and feeding supplements.  Nausea/vomiting and GERD - Continue PPI - Continue as needed antiemetics   Subjective:  No overnight events; continue complaining of intermittent left lower extremity pain.  Physical Exam: Vitals:   10/20/23 1236 10/20/23 2039 10/21/23 0532 10/21/23 1238   BP: 128/76 (!) 151/89 (!) 145/81 124/79  Pulse: (!) 105 (!) 104 95 (!) 108  Resp: 20 15 16    Temp: 97.9 F (36.6 C) (!) 100.7 F (38.2 C) 100.2 F (37.9 C) 98.3 F (36.8 C)  TempSrc: Axillary Oral Oral Oral  SpO2: 99% 99% 98% 98%  Weight:      Height:       General exam: In no major distress; complaining of pain on his left hip intermittently. Respiratory system: Good saturation on room air. Cardiovascular system:RRR. No murmurs, rubs, gallops. Gastrointestinal system: Abdomen is nondistended, soft and nontender. No organomegaly or masses felt. Normal bowel sounds heard. Central nervous system: No focal neurological deficits. Extremities: No cyanosis or clubbing; decreased range of motion in his left lower extremity secondary to pain. Skin: No petechiae. Psychiatry: Flat affect appreciated on exam.  Latest data Reviewed: CBC: WBC 2.0, hemoglobin 10.9 platelet count 303K Basic metabolic panel: Sodium 134, potassium 3.8, chloride 100, bicarb 22, BUN 9, creatinine 0.89 and GFR >60   Family Communication: No family at bedside.  Disposition: Status is: Inpatient Remains inpatient appropriate because: Continue IV therapy.   Time spent: 50 minutes  Author: Eric Nunnery, MD 10/21/2023 5:10 PM  For on call review [email or web address].

## 2023-10-21 NOTE — H&P (View-Only) (Signed)
 Patient ID: Carl Evans, male   DOB: Oct 14, 1970, 53 y.o.   MRN: [id number]   Preop for left hip fracture  Surgery scheduled for Tuesday

## 2023-10-21 NOTE — Progress Notes (Signed)
 Patient ID: Carl Evans, male   DOB: Oct 14, 1970, 53 y.o.   MRN: [id number]   Preop for left hip fracture  Surgery scheduled for Tuesday

## 2023-10-21 NOTE — Plan of Care (Signed)
   Problem: Activity: Goal: Risk for activity intolerance will decrease Outcome: Progressing   Problem: Nutrition: Goal: Adequate nutrition will be maintained Outcome: Progressing   Problem: Coping: Goal: Level of anxiety will decrease Outcome: Progressing

## 2023-10-21 NOTE — TOC CM/SW Note (Signed)
 Transition of Care [facility]) - Inpatient Brief Assessment   Patient Details  Name: Carl Evans MRN: [id number] Date of Birth: December 22, 1970  Transition of Care [facility]) CM/SW Contact:    Lucie Lunger, LCSWA Phone Number: 10/21/2023, 10:07 AM   Clinical Narrative: Transition of Care Department [facility]) has reviewed patient and no TOC needs have been identified at this time. We will continue to monitor patient advancement through interdiciplinary progression rounds. If new patient transition needs arise, please place a TOC consult.  Transition of Care Asessment: Insurance and Status: Insurance coverage has been reviewed Patient has primary care physician: No (PCP list added to AVS) Home environment has been reviewed: From home Prior level of function:: Independent Prior/Current Home Services: No current home services Social Drivers of Health Review: SDOH reviewed no interventions necessary Readmission risk has been reviewed: Yes Transition of care needs: no transition of care needs at this time

## 2023-10-22 ENCOUNTER — Inpatient Hospital Stay (HOSPITAL_COMMUNITY)

## 2023-10-22 ENCOUNTER — Encounter (HOSPITAL_COMMUNITY): Payer: Self-pay | Admitting: Internal Medicine

## 2023-10-22 ENCOUNTER — Encounter (HOSPITAL_COMMUNITY)
Admission: EM | Disposition: A | Discharge status: Home / Self Care | Payer: Self-pay | Source: Home / Self Care | Attending: Internal Medicine

## 2023-10-22 ENCOUNTER — Inpatient Hospital Stay (HOSPITAL_COMMUNITY): Admitting: Certified Registered"

## 2023-10-22 DIAGNOSIS — E871 Hypo-osmolality and hyponatremia: Secondary | ICD-10-CM | POA: Diagnosis not present

## 2023-10-22 DIAGNOSIS — S72142A Displaced intertrochanteric fracture of left femur, initial encounter for closed fracture: Secondary | ICD-10-CM

## 2023-10-22 DIAGNOSIS — F1010 Alcohol abuse, uncomplicated: Secondary | ICD-10-CM | POA: Diagnosis not present

## 2023-10-22 DIAGNOSIS — F17210 Nicotine dependence, cigarettes, uncomplicated: Secondary | ICD-10-CM

## 2023-10-22 DIAGNOSIS — S72002A Fracture of unspecified part of neck of left femur, initial encounter for closed fracture: Secondary | ICD-10-CM | POA: Diagnosis not present

## 2023-10-22 DIAGNOSIS — I10 Essential (primary) hypertension: Secondary | ICD-10-CM

## 2023-10-22 HISTORY — PX: INTRAMEDULLARY (IM) NAIL INTERTROCHANTERIC: SHX5875

## 2023-10-22 LAB — SURGICAL PCR SCREEN
MRSA, PCR: NEGATIVE
Staphylococcus aureus: NEGATIVE

## 2023-10-22 SURGERY — FIXATION, FRACTURE, INTERTROCHANTERIC, WITH INTRAMEDULLARY ROD
Anesthesia: General | Site: Hip | Laterality: Left

## 2023-10-22 MED ORDER — FENTANYL CITRATE PF 50 MCG/ML IJ SOSY
PREFILLED_SYRINGE | INTRAMUSCULAR | Status: AC
  Filled 2023-10-22: qty 1

## 2023-10-22 MED ORDER — ONDANSETRON HCL 4 MG/2ML IJ SOLN
INTRAMUSCULAR | Status: DC | PRN
Start: 2023-10-22 — End: 2023-10-22
  Administered 2023-10-22: 4 mg via INTRAVENOUS

## 2023-10-22 MED ORDER — SODIUM CHLORIDE 0.9 % IV SOLN: INTRAVENOUS | Status: AC

## 2023-10-22 MED ORDER — SODIUM CHLORIDE 0.9 % IV BOLUS
250.0000 mL | Freq: Once | INTRAVENOUS | Status: AC
  Administered 2023-10-22: 250 mL via INTRAVENOUS

## 2023-10-22 MED ORDER — PHENYLEPHRINE HCL-NACL 20-0.9 MG/250ML-% IV SOLN
INTRAVENOUS | Status: AC
  Filled 2023-10-22: qty 250

## 2023-10-22 MED ORDER — ALUM & MAG HYDROXIDE-SIMETH 200-200-20 MG/5ML PO SUSP: 30.0000 mL | ORAL | Status: DC | PRN

## 2023-10-22 MED ORDER — METOCLOPRAMIDE HCL 5 MG/ML IJ SOLN: 5.0000 mg | Freq: Three times a day (TID) | INTRAMUSCULAR | Status: DC | PRN

## 2023-10-22 MED ORDER — LIDOCAINE 2% (20 MG/ML) 5 ML SYRINGE
INTRAMUSCULAR | Status: DC | PRN
Start: 2023-10-22 — End: 2023-10-22
  Administered 2023-10-22: 40 mg via INTRAVENOUS

## 2023-10-22 MED ORDER — OXYCODONE HCL 5 MG/5ML PO SOLN: 5.0000 mg | Freq: Once | ORAL | Status: DC | PRN

## 2023-10-22 MED ORDER — MIDAZOLAM HCL 2 MG/2ML IJ SOLN
INTRAMUSCULAR | Status: DC | PRN
  Administered 2023-10-22: 2 mg via INTRAVENOUS

## 2023-10-22 MED ORDER — METOCLOPRAMIDE HCL 10 MG PO TABS: 5.0000 mg | ORAL_TABLET | Freq: Three times a day (TID) | ORAL | Status: DC | PRN

## 2023-10-22 MED ORDER — POLYETHYLENE GLYCOL 3350 17 G PO PACK: 17.0000 g | PACK | Freq: Every day | ORAL | Status: DC | PRN

## 2023-10-22 MED ORDER — CHLORHEXIDINE GLUCONATE 4 % EX SOLN
60.0000 mL | Freq: Once | CUTANEOUS | Status: AC
Start: 2023-10-22 — End: 2023-10-22
  Administered 2023-10-22: 4 via TOPICAL
  Filled 2023-10-22: qty 15

## 2023-10-22 MED ORDER — DEXMEDETOMIDINE HCL IN NACL 80 MCG/20ML IV SOLN
INTRAVENOUS | Status: DC | PRN
Start: 2023-10-22 — End: 2023-10-22
  Administered 2023-10-22 (×2): 12 ug via INTRAVENOUS

## 2023-10-22 MED ORDER — LIDOCAINE 2% (20 MG/ML) 5 ML SYRINGE
INTRAMUSCULAR | Status: AC
Start: 2023-10-22 — End: 2023-10-22
  Filled 2023-10-22: qty 5

## 2023-10-22 MED ORDER — BUPIVACAINE-EPINEPHRINE (PF) 0.5% -1:200000 IJ SOLN
INTRAMUSCULAR | Status: AC
  Filled 2023-10-22: qty 30

## 2023-10-22 MED ORDER — ONDANSETRON HCL 4 MG/2ML IJ SOLN: 4.0000 mg | Freq: Once | INTRAMUSCULAR | Status: DC | PRN

## 2023-10-22 MED ORDER — EPHEDRINE 5 MG/ML INJ
INTRAVENOUS | Status: AC
  Filled 2023-10-22: qty 10

## 2023-10-22 MED ORDER — FENTANYL CITRATE (PF) 250 MCG/5ML IJ SOLN
INTRAMUSCULAR | Status: DC | PRN
  Administered 2023-10-22 (×4): 50 ug via INTRAVENOUS

## 2023-10-22 MED ORDER — FENTANYL CITRATE (PF) 250 MCG/5ML IJ SOLN
INTRAMUSCULAR | Status: AC
  Filled 2023-10-22: qty 5

## 2023-10-22 MED ORDER — ENOXAPARIN SODIUM 40 MG/0.4ML IJ SOSY
40.0000 mg | PREFILLED_SYRINGE | INTRAMUSCULAR | Status: DC
  Administered 2023-10-23 – 2023-10-24 (×2): 40 mg via SUBCUTANEOUS
  Filled 2023-10-22 (×2): qty 0.4

## 2023-10-22 MED ORDER — DEXAMETHASONE SODIUM PHOSPHATE 10 MG/ML IJ SOLN
INTRAMUSCULAR | Status: AC
Start: 2023-10-22 — End: 2023-10-22
  Filled 2023-10-22: qty 1

## 2023-10-22 MED ORDER — DEXAMETHASONE SODIUM PHOSPHATE 10 MG/ML IJ SOLN
INTRAMUSCULAR | Status: DC | PRN
  Administered 2023-10-22: 8 mg via INTRAVENOUS

## 2023-10-22 MED ORDER — ROCURONIUM BROMIDE 10 MG/ML (PF) SYRINGE
PREFILLED_SYRINGE | INTRAVENOUS | Status: AC
  Filled 2023-10-22: qty 10

## 2023-10-22 MED ORDER — CHLORHEXIDINE GLUCONATE 0.12 % MT SOLN
OROMUCOSAL | Status: AC
  Administered 2023-10-22: 15 mL via OROMUCOSAL
  Filled 2023-10-22: qty 15

## 2023-10-22 MED ORDER — SUGAMMADEX SODIUM 200 MG/2ML IV SOLN
INTRAVENOUS | Status: DC | PRN
  Administered 2023-10-22: 200 mg via INTRAVENOUS

## 2023-10-22 MED ORDER — FENTANYL CITRATE PF 50 MCG/ML IJ SOSY: 25.0000 ug | PREFILLED_SYRINGE | INTRAMUSCULAR | Status: DC | PRN

## 2023-10-22 MED ORDER — POVIDONE-IODINE 10 % EX SWAB
2.0000 | Freq: Once | CUTANEOUS | Status: AC
Start: 2023-10-22 — End: 2023-10-22
  Administered 2023-10-22: 2 via TOPICAL

## 2023-10-22 MED ORDER — TRANEXAMIC ACID-NACL 1000-0.7 MG/100ML-% IV SOLN
1000.0000 mg | INTRAVENOUS | Status: AC
  Administered 2023-10-22: 1000 mg via INTRAVENOUS
  Filled 2023-10-22: qty 100

## 2023-10-22 MED ORDER — CEFAZOLIN SODIUM-DEXTROSE 2-4 GM/100ML-% IV SOLN
2.0000 g | Freq: Four times a day (QID) | INTRAVENOUS | Status: AC
  Administered 2023-10-22 – 2023-10-23 (×2): 2 g via INTRAVENOUS
  Filled 2023-10-22: qty 100

## 2023-10-22 MED ORDER — SODIUM CHLORIDE 0.9 % IR SOLN
Status: DC | PRN
  Administered 2023-10-22: 1000 mL

## 2023-10-22 MED ORDER — ROCURONIUM BROMIDE 10 MG/ML (PF) SYRINGE
PREFILLED_SYRINGE | INTRAVENOUS | Status: DC | PRN
Start: 2023-10-22 — End: 2023-10-22
  Administered 2023-10-22: 50 mg via INTRAVENOUS
  Administered 2023-10-22: 10 mg via INTRAVENOUS

## 2023-10-22 MED ORDER — TRANEXAMIC ACID-NACL 1000-0.7 MG/100ML-% IV SOLN
1000.0000 mg | Freq: Once | INTRAVENOUS | Status: AC
  Administered 2023-10-22: 1000 mg via INTRAVENOUS
  Filled 2023-10-22: qty 100

## 2023-10-22 MED ORDER — BUPIVACAINE-EPINEPHRINE (PF) 0.5% -1:200000 IJ SOLN
INTRAMUSCULAR | Status: DC | PRN
  Administered 2023-10-22: 30 mL via PERINEURAL

## 2023-10-22 MED ORDER — DEXMEDETOMIDINE HCL IN NACL 80 MCG/20ML IV SOLN
INTRAVENOUS | Status: AC
  Filled 2023-10-22: qty 20

## 2023-10-22 MED ORDER — PHENYLEPHRINE 80 MCG/ML (10ML) SYRINGE FOR IV PUSH (FOR BLOOD PRESSURE SUPPORT)
PREFILLED_SYRINGE | INTRAVENOUS | Status: DC | PRN
  Administered 2023-10-22 (×2): 160 ug via INTRAVENOUS

## 2023-10-22 MED ORDER — ONDANSETRON HCL 4 MG/2ML IJ SOLN
INTRAMUSCULAR | Status: AC
  Filled 2023-10-22: qty 2

## 2023-10-22 MED ORDER — LACTATED RINGERS IV SOLN: INTRAVENOUS | Status: DC

## 2023-10-22 MED ORDER — CEFAZOLIN SODIUM-DEXTROSE 2-4 GM/100ML-% IV SOLN
2.0000 g | INTRAVENOUS | Status: AC
  Administered 2023-10-22: 2 g via INTRAVENOUS
  Filled 2023-10-22: qty 100

## 2023-10-22 MED ORDER — FERROUS SULFATE 325 (65 FE) MG PO TABS
325.0000 mg | ORAL_TABLET | Freq: Three times a day (TID) | ORAL | Status: DC
  Administered 2023-10-22 – 2023-10-24 (×6): 325 mg via ORAL
  Filled 2023-10-22 (×6): qty 1

## 2023-10-22 MED ORDER — FENTANYL CITRATE PF 50 MCG/ML IJ SOSY
50.0000 ug | PREFILLED_SYRINGE | Freq: Once | INTRAMUSCULAR | Status: AC
  Administered 2023-10-22: 50 ug via INTRAVENOUS

## 2023-10-22 MED ORDER — ONDANSETRON HCL 4 MG/2ML IJ SOLN
INTRAMUSCULAR | Status: AC
  Filled 2023-10-22: qty 4

## 2023-10-22 MED ORDER — PROPOFOL 10 MG/ML IV BOLUS
INTRAVENOUS | Status: DC | PRN
  Administered 2023-10-22: 150 mg via INTRAVENOUS

## 2023-10-22 MED ORDER — OXYCODONE HCL 5 MG PO TABS: 5.0000 mg | ORAL_TABLET | Freq: Once | ORAL | Status: DC | PRN

## 2023-10-22 MED ORDER — PHENOL 1.4 % MT LIQD: 1.0000 | OROMUCOSAL | Status: DC | PRN

## 2023-10-22 MED ORDER — MIDAZOLAM HCL 2 MG/2ML IJ SOLN
INTRAMUSCULAR | Status: AC
  Filled 2023-10-22: qty 2

## 2023-10-22 MED ORDER — PHENYLEPHRINE 80 MCG/ML (10ML) SYRINGE FOR IV PUSH (FOR BLOOD PRESSURE SUPPORT)
PREFILLED_SYRINGE | INTRAVENOUS | Status: AC
  Filled 2023-10-22: qty 10

## 2023-10-22 MED ORDER — DOCUSATE SODIUM 100 MG PO CAPS
100.0000 mg | ORAL_CAPSULE | Freq: Two times a day (BID) | ORAL | Status: DC
  Administered 2023-10-22 – 2023-10-24 (×3): 100 mg via ORAL
  Filled 2023-10-22 (×4): qty 1

## 2023-10-22 MED ORDER — PROPOFOL 10 MG/ML IV BOLUS
INTRAVENOUS | Status: AC
  Filled 2023-10-22: qty 20

## 2023-10-22 MED ORDER — CHLORHEXIDINE GLUCONATE 0.12 % MT SOLN: 15.0000 mL | Freq: Once | OROMUCOSAL | Status: AC

## 2023-10-22 MED ORDER — MENTHOL 3 MG MT LOZG: 1.0000 | LOZENGE | OROMUCOSAL | Status: DC | PRN

## 2023-10-22 MED ORDER — BISACODYL 5 MG PO TBEC: 5.0000 mg | DELAYED_RELEASE_TABLET | Freq: Every day | ORAL | Status: DC | PRN

## 2023-10-22 MED ORDER — DEXAMETHASONE SODIUM PHOSPHATE 10 MG/ML IJ SOLN
INTRAMUSCULAR | Status: AC
  Filled 2023-10-22: qty 2

## 2023-10-22 SURGICAL SUPPLY — 48 items
BENZOIN TINCTURE PRP APPL 2/3 (GAUZE/BANDAGES/DRESSINGS) IMPLANT
BIT DRILL AO GAMMA 4.2X300 (BIT) ×1 IMPLANT
BLADE SURG SZ10 CARB STEEL (BLADE) ×2 IMPLANT
BNDG GAUZE ELAST 4 BULKY (GAUZE/BANDAGES/DRESSINGS) ×1 IMPLANT
CHLORAPREP W/TINT 26 (MISCELLANEOUS) ×1 IMPLANT
CLOTH BEACON ORANGE TIMEOUT ST (SAFETY) ×1 IMPLANT
COUNTER NDL MAGNETIC 40 RED (SET/KITS/TRAYS/PACK) ×1 IMPLANT
COUNTER NEEDLE MAGNETIC 40 RED (SET/KITS/TRAYS/PACK) ×1 IMPLANT
COVER LIGHT HANDLE STERIS (MISCELLANEOUS) ×2 IMPLANT
COVER PERINEAL POST (MISCELLANEOUS) ×1 IMPLANT
DRAPE STERI IOBAN 125X83 (DRAPES) ×1 IMPLANT
DRESSING AQUACEL AG ADV 3.5X12 (MISCELLANEOUS) ×1 IMPLANT
DRSG MEPILEX SACRM 8.7X9.8 (GAUZE/BANDAGES/DRESSINGS) ×1 IMPLANT
DRSG PAD ABDOMINAL 8X10 ST (GAUZE/BANDAGES/DRESSINGS) ×1 IMPLANT
ELECTRODE REM PT RTRN 9FT ADLT (ELECTROSURGICAL) ×1 IMPLANT
GLOVE BIOGEL PI IND STRL 7.0 (GLOVE) ×2 IMPLANT
GLOVE BIOGEL PI IND STRL 8.5 (GLOVE) ×1 IMPLANT
GLOVE SKINSENSE STRL SZ8.0 LF (GLOVE) ×1 IMPLANT
GOWN STRL REUS W/TWL LRG LVL3 (GOWN DISPOSABLE) ×1 IMPLANT
GOWN STRL REUS W/TWL XL LVL3 (GOWN DISPOSABLE) ×1 IMPLANT
GUIDEROD T2 3X1000 (ROD) ×1 IMPLANT
INST SET MAJOR BONE (KITS) ×1 IMPLANT
KIT TURNOVER CYSTO (KITS) ×1 IMPLANT
KWIRE 3.2X450M STR (WIRE) ×1 IMPLANT
MANIFOLD NEPTUNE II (INSTRUMENTS) ×1 IMPLANT
MARKER SKIN DUAL TIP RULER LAB (MISCELLANEOUS) ×1 IMPLANT
NAIL TROCH GAMMA 11X18 (Nail) IMPLANT
NDL HYPO 21X1.5 SAFETY (NEEDLE) ×1 IMPLANT
NDL SPNL 18GX3.5 QUINCKE PK (NEEDLE) ×1 IMPLANT
NEEDLE HYPO 21X1.5 SAFETY (NEEDLE) ×1 IMPLANT
NEEDLE SPNL 18GX3.5 QUINCKE PK (NEEDLE) ×1 IMPLANT
NS IRRIG 1000ML POUR BTL (IV SOLUTION) ×1 IMPLANT
PACK SRG BSC III STRL LF ECLPS (CUSTOM PROCEDURE TRAY) ×1 IMPLANT
PAD ARMBOARD POSITIONER FOAM (MISCELLANEOUS) ×1 IMPLANT
PENCIL SMOKE EVACUATOR COATED (MISCELLANEOUS) ×1 IMPLANT
POSITIONER HEAD 8X9X4 ADT (SOFTGOODS) ×1 IMPLANT
SCREW LAG GAMMA 3 TI 10.5X90MM (Screw) IMPLANT
SCREW LOCKING T2 F/T 5X37.5MM (Screw) IMPLANT
SET BASIN LINEN APH (SET/KITS/TRAYS/PACK) ×1 IMPLANT
SPONGE T-LAP 18X18 ~~LOC~~+RFID (SPONGE) ×2 IMPLANT
STRIP CLOSURE SKIN 1/2X4 (GAUZE/BANDAGES/DRESSINGS) IMPLANT
SUT BRALON NAB BRD #1 30IN (SUTURE) ×1 IMPLANT
SUT MON AB 2-0 CT1 36 (SUTURE) ×1 IMPLANT
SUT VIC AB 1 CT1 27XBRD ANTBC (SUTURE) IMPLANT
SYR 30ML LL (SYRINGE) ×1 IMPLANT
SYR BULB IRRIG 60ML STRL (SYRINGE) ×2 IMPLANT
TRAY FOLEY MTR SLVR 16FR STAT (SET/KITS/TRAYS/PACK) ×1 IMPLANT
YANKAUER SUCT BULB TIP NO VENT (SUCTIONS) ×1 IMPLANT

## 2023-10-22 NOTE — OR Nursing (Addendum)
 C/o pain to left side rated pain 9. Requesting pain med . Fentynal given 50 mcg per order see mar.

## 2023-10-22 NOTE — Plan of Care (Signed)
 Pt had been NPO since being admitted to the floor for possible surgery. Pt has a NPO at midnight order, night covering provider notified to obtain the diet order so patient could eat prior to midnight.   Pt has been NPO since midnight. CHG bath will be given this morning.    Problem: Education: Goal: Knowledge of General Education information will improve Description: Including pain rating scale, medication(s)/side effects and non-pharmacologic comfort measures Outcome: Progressing   Problem: Health Behavior/Discharge Planning: Goal: Ability to manage health-related needs will improve Outcome: Progressing   Problem: Clinical Measurements: Goal: Ability to maintain clinical measurements within normal limits will improve Outcome: Progressing Goal: Will remain free from infection Outcome: Progressing Goal: Diagnostic test results will improve Outcome: Progressing Goal: Respiratory complications will improve Outcome: Progressing Goal: Cardiovascular complication will be avoided Outcome: Progressing   Problem: Activity: Goal: Risk for activity intolerance will decrease Outcome: Progressing   Problem: Nutrition: Goal: Adequate nutrition will be maintained Outcome: Progressing   Problem: Coping: Goal: Level of anxiety will decrease Outcome: Progressing   Problem: Elimination: Goal: Will not experience complications related to bowel motility Outcome: Progressing Goal: Will not experience complications related to urinary retention Outcome: Progressing   Problem: Pain Managment: Goal: General experience of comfort will improve and/or be controlled Outcome: Progressing   Problem: Safety: Goal: Ability to remain free from injury will improve Outcome: Progressing   Problem: Skin Integrity: Goal: Risk for impaired skin integrity will decrease Outcome: Progressing

## 2023-10-22 NOTE — Interval H&P Note (Signed)
 History and Physical Interval Note:  10/22/2023 12:42 PM  BP (!) 130/91 (BP Location: Right Arm)   Pulse 98   Temp 98 F (36.7 C)   Resp 14   Ht 5' 8 (1.727 m)   Wt 45.3 kg   SpO2 100%   BMI 15.18 kg/m      Latest Ref Rng & Units 10/20/2023    4:52 AM 10/19/2023    9:40 PM 12/26/2021   12:48 AM  CBC  WBC 4.0 - 10.5 K/uL 12.0  8.7  6.1   Hemoglobin 13.0 - 17.0 g/dL 89.0  87.0  85.3   Hematocrit 39.0 - 52.0 % 32.2  36.9  41.3   Platelets 150 - 400 K/uL 303  331  358     Carl Evans  has presented today for surgery, with the diagnosis of Intertrochanteric fracture left hip.  The various methods of treatment have been discussed with the patient and family. After consideration of risks, benefits and other options for treatment, the patient has consented to  Procedure(s): FIXATION, FRACTURE, INTERTROCHANTERIC, WITH INTRAMEDULLARY ROD (Left) as a surgical intervention.  The patient's history has been reviewed, patient examined, no change in status, stable for surgery.  I have reviewed the patient's chart and labs.  Questions were answered to the patient's satisfaction.     Taft Minerva

## 2023-10-22 NOTE — Plan of Care (Signed)
   Problem: Education: Goal: Knowledge of General Education information will improve Description Including pain rating scale, medication(s)/side effects and non-pharmacologic comfort measures Outcome: Progressing   Problem: Education: Goal: Knowledge of General Education information will improve Description Including pain rating scale, medication(s)/side effects and non-pharmacologic comfort measures Outcome: Progressing

## 2023-10-22 NOTE — OR Nursing (Signed)
Pain level now 8.

## 2023-10-22 NOTE — Progress Notes (Signed)
  Progress Note   Patient: Carl Evans FMW:[id number] DOB: 1970-08-27 DOA: 10/19/2023     3 DOS: the patient was seen and examined on 10/22/2023   Brief hospital admission narrative: As per H&P written by Dr. Noralee on 10/19/2023 Carl Evans is a 53 y.o. male with medical history significant of alcohol and tobacco abuse who presented after a mechanical fall.  Apparently patient was at his usual state of health until today evening when he sustained and mechanical fall. He fell off his porch and landed over his left side, hitting his left hip and left elbow, no apparent head trauma or loss of consciousness.  Post fall patient had intense left hip pain and was not able to stand back on his feet. He had a left elbow laceration. EMS was called and he was brought to the ED.  He has been drinking alcohol, 4 beers today. Per his report he drinks alcohol every day.  At baseline he ambulates with no aid of walker or cain.   Assessment and plan Closed left hip fracture, initial encounter (HCC) -Continue as needed analgesics - Appreciate assistance and recommendation by orthopedic service. - After clinical optimization plan is for surgical repair later today on 10/22/2023. - Continue n.p.o. status.  Hyponatremia -In the setting of alcohol abuse - Possible component of mild dehydration - Continue to maintain adequate hydration and follow electrolytes trend.  Alcohol abuse -No major withdrawal symptoms currently appreciated - Continue CIWA protocol - Thiamine  and folic acid  has been ordered - Cessation counseling provided.  Severe protein calorie malnutrition Body mass index is 15.18 kg/m.  - Dietitian service on board - Continue multivitamins and feeding supplements.  Nausea/vomiting and GERD - Continue PPI - Continue as needed antiemetics   Subjective:  No overnight events.  Reports no chest pain, no nausea, no vomiting, no shortness of breath.  Good saturation on room air.  Reporting  intermittent pain in his left lower extremity with any movement or repositioning.  Patient expressed to be hungry.  Physical Exam: Vitals:   10/22/23 0443 10/22/23 0526 10/22/23 0945 10/22/23 1124  BP: 124/78 126/81 135/82 (!) 130/91  Pulse: (!) 113 99 98   Resp: 16 18 18 14   Temp: 98.9 F (37.2 C) 98.3 F (36.8 C) (!) 97.4 F (36.3 C) 98 F (36.7 C)  TempSrc: Oral  Oral   SpO2: 99% 100% 99% 100%  Weight:    45.3 kg  Height:    5' 8 (1.727 m)   General exam: Alert, awake, oriented x 3; following commands appropriately and in no major distress. Respiratory system: Good saturation on room air. Cardiovascular system:RRR. No murmurs, rubs, gallops. Gastrointestinal system: Abdomen is nondistended, soft and nontender. No organomegaly or masses felt. Normal bowel sounds heard. Central nervous system: No focal neurological deficits. Extremities: No cyanosis or clubbing; complaining of left lower extremity pain with movement. Skin: No petechiae. Psychiatry: Judgement and insight appear normal.  Flat affect appreciated on exam.  Latest data Reviewed: CBC: WBC 2.0, hemoglobin 10.9 platelet count 303K Basic metabolic panel: Sodium 134, potassium 3.8, chloride 100, bicarb 22, BUN 9, creatinine 0.89 and GFR >60   Family Communication: No family at bedside.  Disposition: Status is: Inpatient Remains inpatient appropriate because: Continue IV therapy.   Time spent: 50 minutes  Author: Eric Nunnery, MD 10/22/2023 2:30 PM  For on call review [email or web address].

## 2023-10-22 NOTE — Transfer of Care (Addendum)
 Immediate Anesthesia Transfer of Care Note  Patient: Carl Evans  Procedure(s) Performed: FIXATION, FRACTURE, INTERTROCHANTERIC, WITH INTRAMEDULLARY ROD (Left: Hip)  Patient Location: PACU  Anesthesia Type:General  Level of Consciousness: drowsy and patient cooperative  Airway & Oxygen Therapy: Patient Spontanous Breathing  Post-op Assessment: Report given to RN and Post -op Vital signs reviewed and stable  Post vital signs: Reviewed and stable  Last Vitals:  Vitals Value Taken Time  BP 154/91 10/22/23 14:45  Temp 36.4 10/22/23   14:41  Pulse 94 10/22/23 14:47  Resp 16 10/22/23 14:47  SpO2 97 % 10/22/23 14:47  Vitals shown include unfiled device data.  Last Pain:  Vitals:   10/22/23 1157  TempSrc:   PainSc: 5          Complications: No notable events documented.

## 2023-10-22 NOTE — Op Note (Signed)
 Open treatment internal fixation of the hip with CEPHALOMEDULLARY nail  Orthopaedic Surgery Operative Note (CSN: [id number])  Carl Evans  08/10/1970 Date of Surgery: 10/22/2023  Implants: Implant Name Type Inv. Item Serial No. Manufacturer Lot No. LRB No. Used Action  NAIL TROCH GAMMA [id number] - [id number] Nail NAIL TROCH GAMMA 11X18  [employer] [id number] Left 1 Implanted  SCREW LAG GAMMA 3 TI 10.5X90MM - [id number] Screw SCREW LAG GAMMA 3 TI 10.5X90MM  [employer] [id number] Left 1 Implanted  SCREW LOCKING T2 F/T 5X37.5MM - [id number] Screw SCREW LOCKING T2 F/T 5X37.  [employer] [id number] Left 1 Implanted   Postoperative plan Weightbearing as tolerated Anticoagulation for 6 weeks follow-up visit at 4 weeks for x-rays and then x-rays at 8 weeks and 12 weeks [id number]   10/22/2023  2:34 PM  PATIENT:  Carl Evans  53 y.o. male  PRE-OPERATIVE DIAGNOSIS:  Intertrochanteric fracture left hip  POST-OPERATIVE DIAGNOSIS:  Intertrochanteric/Pertrochanteric frx  fracture left hip  PROCEDURE:  Procedure(s): Open treatment internal FIXATION, FRACTURE, INTERTROCHANTERIC, WITH INTRAMEDULLARY ROD (Left)  Findings the fracture was actually peritrochanteric with multiple fracture lines near the base of the femoral neck and in the intertrochanteric region   SURGEON:  Surgeons and Role:    * Margrette Taft BRAVO, MD - Primary  PHYSICIAN ASSISTANT:   ASSISTANTS: none   ANESTHESIA:   general  EBL:  150cc  BLOOD ADMINISTERED:none  DRAINS: none   LOCAL MEDICATIONS USED:  MARCAINE      SPECIMEN:  No Specimen  DISPOSITION OF SPECIMEN:  N/A  COUNTS:  YES  TOURNIQUET:  * No tourniquets in log *  DICTATION: .Dragon Dictation  PLAN OF CARE: Admit to inpatient   PATIENT DISPOSITION:  PACU - hemodynamically stable.   Delay start of Pharmacological VTE agent (>24hrs) due to surgical blood loss or risk of bleeding: not applicable   The surgery was done in the  following manner  The patient was reevaluated in the preop area cleared for surgery.  The surgical site was confirmed and the surgical site was marked  The patient was taken to the operating room for general anesthesia followed by insertion of a Foley catheter.  The patient was placed on the fracture table the right leg was abducted externally rotated and flexed knee and hip.  The left leg was placed in traction  The fracture was manipulated by traction and internal rotation under C-arm guidance until a stable reduction was obtained  Once this was accomplished the leg was prepped and draped sterilely  Timeout was completed  The incision was made over the tip of the greater trochanter and extended proximally.  Subcutaneous tissue was divided including the fascia.  Blunt dissection was carried out and a sharp awl was used to enter the trochanteric tip.  This was confirmed to be in good position on AP and lateral C arm x-rays  Once this was advanced into the shaft a guidewire was placed down to the knee.  This was confirmed by x-ray  The single reamer was placed over the guidewire and reamed down to the area of the lesser trochanter.  The nail was advanced into the femur  A second incision was made distal to the first incision and a cannula was placed down to bone and adjusted until we could get a guidepin in the center of the femoral head on AP and lateral films  Once this was accomplished we took a measurement of 93 mm and reamed to 90 and passed a  90 mm screw.  Screw position was confirmed by x-ray  We then made a third incision distal to the second 1 and Vance the cannula down to bone drilled across the femur through the nail and placed a 30 7.5 millimeter screw  Final imaging confirmed good position of the nail and adequate and stable reduction  The wounds were irrigated with close amounts of saline.  The proximal wound was closed with #1 Surgilon for the fascia #1 Vicryl for the subcu  tissue and 2-0 Monocryl in a subcuticular manner.  The other 2 incisions were closed with 2-0 Monocryl suture.  Steri-Strips were applied to the wound and a sterile dressing was applied  The patient was taken off the fracture table after extubation and taken to recovery room in stable condition  Postop plan as above

## 2023-10-22 NOTE — Progress Notes (Signed)
 Patient off floor for surgical procedure Intramedullary rod to Left Hip.Plan of care on going.

## 2023-10-22 NOTE — Brief Op Note (Signed)
 10/22/2023  2:34 PM  PATIENT:  Carl Evans  53 y.o. male  PRE-OPERATIVE DIAGNOSIS:  Intertrochanteric fracture left hip  POST-OPERATIVE DIAGNOSIS:  Intertrochanteric/Pertrochanteric frx  fracture left hip  PROCEDURE:  Procedure(s): Open treatment internal FIXATION, FRACTURE, INTERTROCHANTERIC, WITH INTRAMEDULLARY ROD (Left)  Findings the fracture was actually peritrochanteric with multiple fracture lines near the base of the femoral neck and in the intertrochanteric region   SURGEON:  Surgeons and Role:    * Margrette Taft BRAVO, MD - Primary  PHYSICIAN ASSISTANT:   ASSISTANTS: none   ANESTHESIA:   general  EBL:  150cc  BLOOD ADMINISTERED:none  DRAINS: none   LOCAL MEDICATIONS USED:  MARCAINE      SPECIMEN:  No Specimen  DISPOSITION OF SPECIMEN:  N/A  COUNTS:  YES  TOURNIQUET:  * No tourniquets in log *  DICTATION: .Dragon Dictation  PLAN OF CARE: Admit to inpatient   PATIENT DISPOSITION:  PACU - hemodynamically stable.   Delay start of Pharmacological VTE agent (>24hrs) due to surgical blood loss or risk of bleeding: not applicable

## 2023-10-22 NOTE — Anesthesia Procedure Notes (Signed)
 Procedure Name: Intubation Date/Time: 10/22/2023 1:03 PM  Performed by: Para Jerelene CROME, CRNAPre-anesthesia Checklist: Patient identified, Emergency Drugs available, Suction available and Patient being monitored Patient Re-evaluated:Patient Re-evaluated prior to induction Oxygen Delivery Method: Circle system utilized Preoxygenation: Pre-oxygenation with 100% oxygen Induction Type: IV induction Ventilation: Mask ventilation without difficulty Laryngoscope Size: Mac and 4 Grade View: Grade I Tube type: Oral Tube size: 7.0 mm Number of attempts: 1 Airway Equipment and Method: Stylet Placement Confirmation: positive ETCO2, ETT inserted through vocal cords under direct vision, CO2 detector and breath sounds checked- equal and bilateral Secured at: 23 (OETT positioned 23 cm at lower lip.) cm Tube secured with: Tape Dental Injury: Teeth and Oropharynx as per pre-operative assessment  Comments: Atraumatic intubation x 1. Poor dentition observed. Lips and teeth remain in preoperative condition.

## 2023-10-22 NOTE — Progress Notes (Signed)
No pain at present 

## 2023-10-22 NOTE — Progress Notes (Addendum)
 Patient given Incentive Spirometry,per MD's orders.Patient Educated and given instructions on how to use Incentive Spirometry, verbalized understanding by return demonstration.Plan of care on going.

## 2023-10-22 NOTE — Anesthesia Preprocedure Evaluation (Signed)
 Anesthesia Evaluation  Patient identified by MRN, date of birth, ID band Patient awake    Reviewed: Allergy & Precautions, H&P , NPO status , Patient's Chart, lab work & pertinent test results, reviewed documented beta blocker date and time   Airway Mallampati: II  TM Distance: >3 FB Neck ROM: full    Dental no notable dental hx.    Pulmonary pneumonia, Current Smoker and Patient abstained from smoking.   Pulmonary exam normal breath sounds clear to auscultation       Cardiovascular Exercise Tolerance: Good hypertension, negative cardio ROS  Rhythm:regular Rate:Normal     Neuro/Psych  PSYCHIATRIC DISORDERS      negative neurological ROS     GI/Hepatic negative GI ROS,,,(+)     substance abuse  alcohol use  Endo/Other  negative endocrine ROS    Renal/GU negative Renal ROS  negative genitourinary   Musculoskeletal   Abdominal   Peds  Hematology  (+) Blood dyscrasia, anemia   Anesthesia Other Findings   Reproductive/Obstetrics negative OB ROS                              Anesthesia Physical Anesthesia Plan  ASA: 3 and emergent  Anesthesia Plan: General and General LMA   Post-op Pain Management:    Induction:   PONV Risk Score and Plan: Ondansetron   Airway Management Planned:   Additional Equipment:   Intra-op Plan:   Post-operative Plan:   Informed Consent: I have reviewed the patients History and Physical, chart, labs and discussed the procedure including the risks, benefits and alternatives for the proposed anesthesia with the patient or authorized representative who has indicated his/her understanding and acceptance.     Dental Advisory Given  Plan Discussed with: CRNA  Anesthesia Plan Comments:         Anesthesia Quick Evaluation

## 2023-10-22 NOTE — Progress Notes (Addendum)
 Nurse at bedside,patient alert and oriented to person,place,and situation,a little confused to time. No c/o pain or discomfort noted at this time.Plan of care on going.

## 2023-10-23 ENCOUNTER — Encounter (HOSPITAL_COMMUNITY): Payer: Self-pay | Admitting: Orthopedic Surgery

## 2023-10-23 DIAGNOSIS — F1010 Alcohol abuse, uncomplicated: Secondary | ICD-10-CM | POA: Diagnosis not present

## 2023-10-23 DIAGNOSIS — S72002A Fracture of unspecified part of neck of left femur, initial encounter for closed fracture: Secondary | ICD-10-CM | POA: Diagnosis not present

## 2023-10-23 DIAGNOSIS — D62 Acute posthemorrhagic anemia: Secondary | ICD-10-CM

## 2023-10-23 DIAGNOSIS — E871 Hypo-osmolality and hyponatremia: Secondary | ICD-10-CM | POA: Diagnosis not present

## 2023-10-23 LAB — BASIC METABOLIC PANEL WITH GFR
Anion gap: 8 (ref 5–15)
BUN: 10 mg/dL (ref 6–20)
CO2: 23 mmol/L (ref 22–32)
Calcium: 7.7 mg/dL — ABNORMAL LOW (ref 8.9–10.3)
Chloride: 101 mmol/L (ref 98–111)
Creatinine, Ser: 1 mg/dL (ref 0.61–1.24)
GFR, Estimated: >60 mL/min (ref >60)
Glucose, Bld: 129 mg/dL — ABNORMAL HIGH (ref 70–99)
Potassium: 3.7 mmol/L (ref 3.5–5.1)
Sodium: 132 mmol/L — ABNORMAL LOW (ref 135–145)

## 2023-10-23 LAB — CBC
HCT: 24.9 % — ABNORMAL LOW (ref 39.0–52.0)
Hemoglobin: 8.4 g/dL — ABNORMAL LOW (ref 13.0–17.0)
MCH: 31.6 pg (ref 26.0–34.0)
MCHC: 33.7 g/dL (ref 30.0–36.0)
MCV: 93.6 fL (ref 80.0–100.0)
Platelets: 253 K/uL (ref 150–400)
RBC: 2.66 MIL/uL — ABNORMAL LOW (ref 4.22–5.81)
RDW: 14 % (ref 11.5–15.5)
WBC: 10.9 K/uL — ABNORMAL HIGH (ref 4.0–10.5)
nRBC: 0 % (ref 0.0–0.2)

## 2023-10-23 LAB — GLUCOSE, CAPILLARY: Glucose-Capillary: 117 mg/dL — ABNORMAL HIGH (ref 70–99)

## 2023-10-23 MED ORDER — NICOTINE 21 MG/24HR TD PT24
21.0000 mg | MEDICATED_PATCH | Freq: Every day | TRANSDERMAL | Status: DC
  Administered 2023-10-23 – 2023-10-24 (×2): 21 mg via TRANSDERMAL
  Filled 2023-10-23 (×2): qty 1

## 2023-10-23 NOTE — Evaluation (Signed)
 Physical Therapy Evaluation Patient Details Name: Carl Evans MRN: [id number] DOB: 1970/08/18 Today's Date: 10/23/2023  History of Present Illness  Carl Evans is a 53 y.o. male with medical history significant of alcohol and tobacco abuse who presented after a mechanical fall on 9/13. Carl Evans has presented 9/16 for surgery, with the diagnosis of Intertrochanteric fracture left hip.  FIXATION, FRACTURE, INTERTROCHANTERIC, WITH INTRAMEDULLARY ROD (Left) as a surgical intervention. (per MD)   Clinical Impression  Patient demonstrates slow labored movement for sitting up at bedside but demonstrates good return for completing bed mobility tasks without physical assistance. Patients demonstrates increased difficulty with STS secondary to LLE pain and weakness, requiring minA to boost into full standing and tactile cueing for upright posture. In standing patient slightly unsteady secondary to increased forward flex posture compensation. Patient able to tolerate very short distance ambulation using RW but limited mostly due to elevated pain levels at this time. RW recommended for patient use in short-term in order to increase patient functional independence and stability. Patient will benefit from continued skilled physical therapy in hospital and recommended venue below to increase strength, balance, endurance for safe ADLs and gait.        If plan is discharge home, recommend the following: A little help with walking and/or transfers;Help with stairs or ramp for entrance;Assist for transportation   Can travel by private vehicle        Equipment Recommendations Rolling walker (2 wheels)  Recommendations for Other Services       Functional Status Assessment Patient has had a recent decline in their functional status and demonstrates the ability to make significant improvements in function in a reasonable and predictable amount of time.     Precautions / Restrictions  Precautions Precautions: Fall Recall of Precautions/Restrictions: Intact Restrictions Weight Bearing Restrictions Per Provider Order: Yes LLE Weight Bearing Per Provider Order: Weight bearing as tolerated      Mobility  Bed Mobility Overal bed mobility: Needs Assistance Bed Mobility: Supine to Sit     Supine to sit: Modified independent (Device/Increase time)     General bed mobility comments: increased time, labored movement for LLE    Transfers Overall transfer level: Needs assistance Equipment used: Rolling walker (2 wheels) Transfers: Sit to/from Stand, Bed to chair/wheelchair/BSC Sit to Stand: Min assist   Step pivot transfers: Contact guard assist       General transfer comment: Labored movement, minA for boosting into full standing, very forward leaning in standing secondary to pain/stiffness    Ambulation/Gait Ambulation/Gait assistance: Contact guard assist Gait Distance (Feet): 20 Feet Assistive device: Rolling walker (2 wheels) Gait Pattern/deviations: Decreased step length - right, Decreased step length - left, Decreased stride length, Decreased stance time - left, Decreased weight shift to left, Trunk flexed Gait velocity: Decreased     General Gait Details: Tolerates very short distance ambulation with above noted gait deviations, limited mostly by LLE pain  Stairs            Wheelchair Mobility     Tilt Bed    Modified Rankin (Stroke Patients Only)       Balance Overall balance assessment: Needs assistance Sitting-balance support: No upper extremity supported, Feet supported Sitting balance-Leahy Scale: Good Sitting balance - Comments: seated EOB   Standing balance support: Bilateral upper extremity supported, During functional activity, Reliant on assistive device for balance Standing balance-Leahy Scale: Fair Standing balance comment: poor/fair using RW  Pertinent Vitals/Pain Pain  Assessment Pain Assessment: Faces Faces Pain Scale: Hurts even more Pain Location: L thigh Pain Descriptors / Indicators: Aching, Discomfort Pain Intervention(s): Monitored during session, Limited activity within patient's tolerance    Home Living Family/patient expects to be discharged to:: Private residence Living Arrangements: Other relatives Available Help at Discharge: Family;Available 24 hours/day Type of Home: House Home Access: Stairs to enter Entrance Stairs-Rails: None Entrance Stairs-Number of Steps: 1   Home Layout: One level Home Equipment: None Additional Comments: Pt reports living with niece and her husband    Prior Function Prior Level of Function : Needs assist       Physical Assist : ADLs (physical)   ADLs (physical): IADLs Mobility Comments: Household and community ambulator without AD ADLs Comments: Pt reports independence with ADLs, does not drive; niece assists with IADLs     Extremity/Trunk Assessment        Lower Extremity Assessment Lower Extremity Assessment: LLE deficits/detail LLE Deficits / Details: s/p L femur ORIF LLE: Unable to fully assess due to pain    Cervical / Trunk Assessment Cervical / Trunk Assessment: Normal  Communication   Communication Communication: No apparent difficulties    Cognition Arousal: Alert Behavior During Therapy: WFL for tasks assessed/performed   PT - Cognitive impairments: No apparent impairments                         Following commands: Intact       Cueing Cueing Techniques: Verbal cues, Tactile cues     General Comments      Exercises     Assessment/Plan    PT Assessment Patient needs continued PT services  PT Problem List Decreased strength;Decreased mobility;Decreased range of motion;Decreased activity tolerance;Decreased balance       PT Treatment Interventions DME instruction;Therapeutic activities;Therapeutic exercise;Patient/family education;Gait training;Balance  training;Functional mobility training    PT Goals (Current goals can be found in the Care Plan section)  Acute Rehab PT Goals Patient Stated Goal: return home PT Goal Formulation: With patient Time For Goal Achievement: 11/06/23 Potential to Achieve Goals: Good    Frequency Min 5X/week     Co-evaluation               AM-PAC PT 6 Clicks Mobility  Outcome Measure Help needed turning from your back to your side while in a flat bed without using bedrails?: A Little Help needed moving from lying on your back to sitting on the side of a flat bed without using bedrails?: A Little Help needed moving to and from a bed to a chair (including a wheelchair)?: A Little Help needed standing up from a chair using your arms (e.g., wheelchair or bedside chair)?: A Little Help needed to walk in hospital room?: A Little Help needed climbing 3-5 steps with a railing? : A Lot 6 Click Score: 17    End of Session Equipment Utilized During Treatment: Gait belt Activity Tolerance: Patient limited by pain Patient left: in chair;with nursing/sitter in room;with call bell/phone within reach Nurse Communication: Mobility status PT Visit Diagnosis: Unsteadiness on feet (R26.81);Muscle weakness (generalized) (M62.81);Other abnormalities of gait and mobility (R26.89)    Time: 9143-9075 PT Time Calculation (min) (ACUTE ONLY): 28 min   Charges:   PT Evaluation $PT Eval Moderate Complexity: 1 Mod PT Treatments $Therapeutic Activity: 23-37 mins PT General Charges $$ ACUTE PT VISIT: 1 Visit         12:48 PM, 10/23/23,  Onnie Como, SPT

## 2023-10-23 NOTE — Progress Notes (Signed)
 Patient ID: Carl Evans, male   DOB: 12-25-1970, 53 y.o.   MRN: [id number]   Postop day 1 cephalomedullary nail left hip  Patient in reasonably good condition considering his inebriation  I saw his dressing it was saturated.  The nurse called me about it and asked her to change it and reinforce as needed  Discharge pending multiple factors including social factors.  I did speak to his sister today and updated her on his course

## 2023-10-23 NOTE — Anesthesia Postprocedure Evaluation (Signed)
 Anesthesia Post Note  Patient: Carl Evans  Procedure(s) Performed: FIXATION, FRACTURE, INTERTROCHANTERIC, WITH INTRAMEDULLARY ROD (Left: Hip)  Patient location during evaluation: Phase II Anesthesia Type: General Level of consciousness: awake Pain management: pain level controlled Vital Signs Assessment: post-procedure vital signs reviewed and stable Respiratory status: spontaneous breathing and respiratory function stable Cardiovascular status: blood pressure returned to baseline and stable Postop Assessment: no headache and no apparent nausea or vomiting Anesthetic complications: no Comments: Late entry   No notable events documented.   Last Vitals:  Vitals:   10/22/23 1937 10/23/23 0304  BP: 123/71 (!) 148/91  Pulse: 100 98  Resp: 18 18  Temp: 36.9 C 36.9 C  SpO2: 98% 100%    Last Pain:  Vitals:   10/23/23 0525  TempSrc:   PainSc: Asleep    LLE Motor Response: Responds to commands (10/23/23 0728) LLE Sensation: Full sensation (10/23/23 0728)          Yvonna JINNY Bosworth

## 2023-10-23 NOTE — Progress Notes (Signed)
 Progress Note   Patient: Carl Evans FMW:[id number] DOB: 1970/03/14 DOA: 10/19/2023     4 DOS: the patient was seen and examined on 10/23/2023   Brief hospital admission narrative: As per H&P written by Dr. Noralee on 10/19/2023 [name] is a 53 y.o. male with medical history significant of alcohol and tobacco abuse who presented after a mechanical fall.  Apparently patient was at his usual state of health until today evening when he sustained and mechanical fall. He fell off his porch and landed over his left side, hitting his left hip and left elbow, no apparent head trauma or loss of consciousness.  Post fall patient had intense left hip pain and was not able to stand back on his feet. He had a left elbow laceration. EMS was called and he was brought to the ED.  He has been drinking alcohol, 4 beers today. Per his report he drinks alcohol every day.  At baseline he ambulates with no aid of walker or cain.   Assessment and plan Closed left hip fracture, initial encounter (HCC) -Continue as needed analgesics -Will follow final recommendations by PT/OT for discharge purposes. - Status post ORIF with cephalomedullary nail on his left hip on 10/22/2023 - Continue to follow postoperative recommendation by orthopedic service.  Hyponatremia -In the setting of alcohol abuse - Possible component of mild dehydration - Continue to maintain adequate hydration and follow electrolytes trend.  Alcohol abuse -No major withdrawal symptoms currently appreciated - Continue CIWA protocol - Thiamine  and folic acid  has been ordered - Cessation counseling provided.  Severe protein calorie malnutrition Body mass index is 15.18 kg/m.  - Dietitian service on board - Continue multivitamins and feeding supplements.  Nausea/vomiting and GERD - Continue PPI - Continue as needed antiemetics  Acute blood loss postoperative anemia - Hemoglobin down to 8.4 - No transfusion currently needed - Iron  supplementation started - Continue to follow hemoglobin trend.   Subjective:  Reported intermittent pain in his left hip area; no chest pain, no nausea, no vomiting, no shortness of breath.  Status post surgical intervention for ORIF on 10/22/2023.  Physical Exam: Vitals:   10/22/23 1937 10/23/23 0304 10/23/23 0728 10/23/23 1429  BP: 123/71 (!) 148/91 (!) 140/91 139/88  Pulse: 100 98 87 (!) 110  Resp: 18 18  18   Temp: 98.4 F (36.9 C) 98.4 F (36.9 C) 97.7 F (36.5 C) (!) 97.5 F (36.4 C)  TempSrc: Oral Oral Oral Oral  SpO2: 98% 100% 99% 100%  Weight:      Height:       General exam: Alert, awake, oriented x 3;  in no acute distress. Respiratory system:   Good saturation on room air; no using accessory muscle. Cardiovascular system:RRR. No murmurs, rubs, gallops. Gastrointestinal system: Abdomen is nondistended, soft and nontender. No organomegaly or masses felt. Normal bowel sounds heard. Central nervous system:  No focal neurological deficits. Extremities: No cyanosis or clubbing. Skin: No   Petechiae.  Clean dressings appreciated on the lateral left hip area Psychiatry: Judgement and insight appear normal.  Flat affect appreciated on exam.   Latest data Reviewed: CBC: WBC 10.9, hemoglobin 8.4 and platelet count 253K Basic metabolic panel:Sodium 132, potassium 3.7, chloride 101, bicarb 23, BUN 10, creatinine 1.00 GFR >30   Family Communication: No family at bedside.  Disposition: Status is: Inpatient Remains inpatient appropriate because: Continue IV therapy.   Time spent: 50 minutes  Author: Eric Nunnery, MD 10/23/2023 6:05 PM  For on call  review [email or web address].

## 2023-10-23 NOTE — Progress Notes (Signed)
 Patient assisted to and from bathroom using front wheel walker times one assist,patient did well with ambulation. Patient assisted back to bed,call bell in reach. Patient is c/o pain to surgical area  the left hip, rated a 8,dull and a constant pain.Plan of care on going.

## 2023-10-23 NOTE — TOC Transition Note (Signed)
 Transition of Care [facility]) - Discharge Note   Patient Details  Name: Carl Evans MRN: [id number] Date of Birth: 1970-06-11  [facility] CM/SW Contact:  Lucie Lunger, LCSWA Phone Number: 10/23/2023, 2:10 PM   Clinical Narrative:    CSW updated that PT is recommending HH PT for pt at D/C. CSW reached out to multiple [facility] agencies, due to pts insurance and no PCP there are no agencies able to accept pt for services at this time. CSW spoke with pt to review. CSW spoke with pt about OP PT services being arranged, at this time he is agreeable. CSW made referral to [facility] OP PT office. TOC signing off.   Final next level of care: OP Rehab Barriers to Discharge: Barriers Resolved   Patient Goals and CMS Choice Patient states their goals for this hospitalization and ongoing recovery are:: return home CMS Medicare.gov Compare Post Acute Care list provided to:: Patient Choice offered to / list presented to : Patient      Discharge Placement                       Discharge Plan and Services Additional resources added to the After Visit Summary for                                       Social Drivers of Health (SDOH) Interventions SDOH Screenings   Food Insecurity: No Food Insecurity (10/20/2023)  Housing: Low Risk  (10/21/2023)  Transportation Needs: No Transportation Needs (10/21/2023)  Utilities: Not At Risk (10/21/2023)  Tobacco Use: High Risk (10/22/2023)     Readmission Risk Interventions     No data to display

## 2023-10-23 NOTE — Plan of Care (Signed)
  Problem: Acute Rehab PT Goals(only PT should resolve) Goal: Pt Will Go Supine/Side To Sit Outcome: Progressing Flowsheets (Taken 10/23/2023 1249) Pt will go Supine/Side to Sit: Independently Goal: Patient Will Transfer Sit To/From Stand Outcome: Progressing Flowsheets (Taken 10/23/2023 1249) Patient will transfer sit to/from stand: with contact guard assist Goal: Pt Will Transfer Bed To Chair/Chair To Bed Outcome: Progressing Flowsheets (Taken 10/23/2023 1249) Pt will Transfer Bed to Chair/Chair to Bed: with supervision Goal: Pt Will Ambulate Outcome: Progressing Flowsheets (Taken 10/23/2023 1249) Pt will Ambulate:  with modified independence  75 feet  with least restrictive assistive device

## 2023-10-23 NOTE — Progress Notes (Addendum)
 Nurse at bedside,dressing was saturated to left hip,surgical area,Dr Margrette notified,orders received . Dressing changed to left hip area,Mepilex Border Post-op applied to surgical site.Patient tolerated dressing change to area.Plan of care on going.

## 2023-10-23 NOTE — Progress Notes (Signed)
 Nurse at bedside, 16 french foley catheter discontinued per MD's orders. Explained procedure to patient,deflated balloon,10 ml's of normal saline returned to syringe.Patient tolerated removal well.Plan of care on going.

## 2023-10-24 DIAGNOSIS — S72102D Unspecified trochanteric fracture of left femur, subsequent encounter for closed fracture with routine healing: Secondary | ICD-10-CM

## 2023-10-24 LAB — BASIC METABOLIC PANEL WITH GFR
Anion gap: 9 (ref 5–15)
BUN: 8 mg/dL (ref 6–20)
CO2: 24 mmol/L (ref 22–32)
Calcium: 7.9 mg/dL — ABNORMAL LOW (ref 8.9–10.3)
Chloride: 104 mmol/L (ref 98–111)
Creatinine, Ser: 0.92 mg/dL (ref 0.61–1.24)
GFR, Estimated: >60 mL/min (ref >60)
Glucose, Bld: 101 mg/dL — ABNORMAL HIGH (ref 70–99)
Potassium: 3.5 mmol/L (ref 3.5–5.1)
Sodium: 137 mmol/L (ref 135–145)

## 2023-10-24 LAB — CBC
HCT: 23 % — ABNORMAL LOW (ref 39.0–52.0)
Hemoglobin: 7.7 g/dL — ABNORMAL LOW (ref 13.0–17.0)
MCH: 31.4 pg (ref 26.0–34.0)
MCHC: 33.5 g/dL (ref 30.0–36.0)
MCV: 93.9 fL (ref 80.0–100.0)
Platelets: 313 K/uL (ref 150–400)
RBC: 2.45 MIL/uL — ABNORMAL LOW (ref 4.22–5.81)
RDW: 13.9 % (ref 11.5–15.5)
WBC: 8.9 K/uL (ref 4.0–10.5)
nRBC: 0 % (ref 0.0–0.2)

## 2023-10-24 LAB — ABO/RH: ABO/RH(D): O POS

## 2023-10-24 LAB — PREPARE RBC (CROSSMATCH)

## 2023-10-24 MED ORDER — FERROUS SULFATE 325 (65 FE) MG PO TABS: 325.0000 mg | ORAL_TABLET | Freq: Three times a day (TID) | ORAL | 3 refills | Status: AC

## 2023-10-24 MED ORDER — ADULT MULTIVITAMIN W/MINERALS CH: 1.0000 | ORAL_TABLET | Freq: Every day | ORAL | 3 refills | Status: AC

## 2023-10-24 MED ORDER — OXYCODONE HCL 5 MG PO TABS: 5.0000 mg | ORAL_TABLET | Freq: Four times a day (QID) | ORAL | 0 refills | Status: AC | PRN

## 2023-10-24 MED ORDER — ENSURE PLUS HIGH PROTEIN PO LIQD: 237.0000 mL | Freq: Two times a day (BID) | ORAL | Status: AC

## 2023-10-24 MED ORDER — SODIUM CHLORIDE 0.9% IV SOLUTION: Freq: Once | INTRAVENOUS | Status: AC

## 2023-10-24 MED ORDER — POLYETHYLENE GLYCOL 3350 17 G PO PACK: 17.0000 g | PACK | Freq: Every day | ORAL | 0 refills | Status: AC | PRN

## 2023-10-24 MED ORDER — NICOTINE 21 MG/24HR TD PT24: 21.0000 mg | MEDICATED_PATCH | Freq: Every day | TRANSDERMAL | 0 refills | Status: AC

## 2023-10-24 MED ORDER — DOCUSATE SODIUM 100 MG PO CAPS: 100.0000 mg | ORAL_CAPSULE | Freq: Two times a day (BID) | ORAL | 0 refills | Status: AC

## 2023-10-24 MED ORDER — FOLIC ACID 1 MG PO TABS: 1.0000 mg | ORAL_TABLET | Freq: Every day | ORAL | 2 refills | Status: AC

## 2023-10-24 MED ORDER — PANTOPRAZOLE SODIUM 40 MG PO TBEC: 40.0000 mg | DELAYED_RELEASE_TABLET | Freq: Two times a day (BID) | ORAL | 1 refills | Status: AC

## 2023-10-24 MED ORDER — ASPIRIN 81 MG PO TBEC: 81.0000 mg | DELAYED_RELEASE_TABLET | Freq: Two times a day (BID) | ORAL | 0 refills | Status: AC

## 2023-10-24 MED ORDER — ACETAMINOPHEN 500 MG PO TABS: 1000.0000 mg | ORAL_TABLET | Freq: Three times a day (TID) | ORAL | Status: AC | PRN

## 2023-10-24 MED ORDER — FUROSEMIDE 10 MG/ML IJ SOLN
20.0000 mg | Freq: Once | INTRAMUSCULAR | Status: AC
  Administered 2023-10-24: 20 mg via INTRAVENOUS
  Filled 2023-10-24: qty 2

## 2023-10-24 MED ORDER — THIAMINE HCL 100 MG PO TABS: 100.0000 mg | ORAL_TABLET | Freq: Every day | ORAL | 3 refills | Status: AC

## 2023-10-24 NOTE — Discharge Summary (Signed)
 Physician Discharge Summary   Patient: Carl Evans MRN: [id number] DOB: 1970/08/02  Admit date:     10/19/2023  Discharge date: 10/24/23  Discharge Physician: Eric Nunnery   PCP: Patient, No Pcp Per   Recommendations at discharge:  Repeat CBC to follow hemoglobin trend/stability Continue assisting patient with tobacco and alcohol cessation Repeat basic metabolic panel and magnesium  level to follow electrolytes trend and renal function and stability.  Discharge Diagnoses: Principal Problem:   Closed pertrochanteric fracture of proximal end of left femur (HCC) Active Problems:   Hyponatremia   Alcohol abuse Postoperative acute blood loss anemia   Brief hospital admission narrative: As per H&P written by Dr. Noralee on 10/19/2023 Carl Evans is a 53 y.o. male with medical history significant of alcohol and tobacco abuse who presented after a mechanical fall.  Apparently patient was at his usual state of health until today evening when he sustained and mechanical fall. He fell off his porch and landed over his left side, hitting his left hip and left elbow, no apparent head trauma or loss of consciousness.  Post fall patient had intense left hip pain and was not able to stand back on his feet. He had a left elbow laceration. EMS was called and he was brought to the ED.  He has been drinking alcohol, 4 beers today. Per his report he drinks alcohol every day.  At baseline he ambulates with no aid of walker or cain.   Assessment and Plan: Closed left hip fracture, initial encounter (HCC) - Status post ORIF with cephalomedullary nail on his left hip on 10/22/2023 - Continue to follow postoperative recommendation by orthopedic service. - As needed analgesics provided at discharge; patient instructed to use as needed laxative to prevent constipation and pursued outpatient physical therapy rehabilitation. -Aspirin  twice a day for DVT prophylaxis provided.   Hyponatremia -In the setting  of alcohol abuse - Possible component of mild dehydration - Continue to maintain adequate hydration and follow electrolytes trend.   Alcohol abuse -No major withdrawal symptoms currently appreciated - Continue CIWA protocol - Thiamine  and folic acid  has been ordered - Cessation counseling provided.   Severe protein calorie malnutrition Body mass index is 15.18 kg/m.  - Dietitian service on board - Continue multivitamins and feeding supplements.   Nausea/vomiting and GERD - Continue PPI - Continue as needed antiemetics   Acute blood loss postoperative anemia - Hemoglobin down to 7.7; 1 unit PRBCs transfused.  Decision. - Ferrous sulfate  prescribed at discharge -Repeat CBC at follow-up visit to assess stability.  Consultants: Orthopedic service Procedures performed: See below for x-ray reports; status post ORIF 10/22/2023 Disposition: Home Diet recommendation: Regular diet.  DISCHARGE MEDICATION: Allergies as of 10/24/2023   No Known Allergies      Medication List     TAKE these medications    acetaminophen  500 MG tablet Commonly known as: TYLENOL  Take 2 tablets (1,000 mg total) by mouth every 8 (eight) hours as needed for mild pain (pain score 1-3), headache or fever.   aspirin  EC 81 MG tablet Take 1 tablet (81 mg total) by mouth 2 (two) times daily. Swallow whole.   docusate sodium  100 MG capsule Commonly known as: COLACE Take 1 capsule (100 mg total) by mouth 2 (two) times daily.   feeding supplement Liqd Take 237 mLs by mouth 2 (two) times daily between meals. Start taking on: October 25, 2023   ferrous sulfate  325 (65 FE) MG tablet Take 1 tablet (325 mg total) by  mouth 3 (three) times daily after meals.   folic acid  1 MG tablet Commonly known as: FOLVITE  Take 1 tablet (1 mg total) by mouth daily. Start taking on: October 25, 2023   multivitamin with minerals Tabs tablet Take 1 tablet by mouth daily.   nicotine  21 mg/24hr patch Commonly known  as: NICODERM CQ  - dosed in mg/24 hours Place 1 patch (21 mg total) onto the skin daily. Start taking on: October 25, 2023   oxyCODONE  5 MG immediate release tablet Commonly known as: Oxy IR/ROXICODONE  Take 1 tablet (5 mg total) by mouth every 6 (six) hours as needed for up to 7 days for severe pain (pain score 7-10).   pantoprazole  40 MG tablet Commonly known as: PROTONIX  Take 1 tablet (40 mg total) by mouth 2 (two) times daily.   polyethylene glycol 17 g packet Commonly known as: MIRALAX  / GLYCOLAX  Take 17 g by mouth daily as needed for mild constipation.   thiamine  100 MG tablet Commonly known as: VITAMIN B1 Take 1 tablet (100 mg total) by mouth daily.               Discharge Care Instructions  (From admission, onward)           Start     Ordered   10/24/23 0000  Discharge wound care:       Comments: Keep area clean and dry; reinforce dressing as needed also discontinue   10/24/23 1550            Follow-up Information     Margrette Taft BRAVO, MD. Call.   Specialties: Orthopedic Surgery, Radiology Why: To arrange follow-up visit in the next 2-3 weeks. Contact information: [address] [place] [address] [phone]                Discharge Exam: [name]   10/19/23 2106 10/19/23 2355 10/22/23 1124  Weight: 54.4 kg 45.3 kg 45.3 kg   General exam: Alert, awake, oriented x 3 Respiratory system: Clear to auscultation. Respiratory effort normal. Cardiovascular system:RRR. No murmurs, rubs, gallops. Gastrointestinal system: Abdomen is nondistended, soft and nontender. No organomegaly or masses felt. Normal bowel sounds heard. Central nervous system: Alert and oriented. No focal neurological deficits. Extremities: No cyanosis or clubbing.  Left lower extremity with lateral incision demonstrates to be clean and dry.  Clean dressings in place.  Still complaining of intermittent pain. Skin: No petechiae. Psychiatry: Judgement and  insight appear normal. Mood & affect appropriate.    Condition at discharge: Stable and improved.  The results of significant diagnostics from this hospitalization (including imaging, microbiology, ancillary and laboratory) are listed below for reference.   Imaging Studies: DG HIP UNILAT WITH PELVIS 2-3 VIEWS LEFT Result Date: 10/22/2023 CLINICAL DATA:  Fracture, postop. EXAM: DG HIP (WITH OR WITHOUT PELVIS) 2-3V LEFT COMPARISON:  Preoperative imaging. FINDINGS: Femoral intramedullary nail with trans trochanteric and distal locking screw fixation traverse proximal femur fracture. Decreased angulation from preoperative imaging. Recent postsurgical change includes air and edema in the soft tissues. IMPRESSION: ORIF of proximal femur fracture, in improved alignment from preoperative imaging. Electronically Signed   By: Andrea Gasman M.D.   On: 10/22/2023 16:47   DG HIP UNILAT WITH PELVIS 2-3 VIEWS LEFT Result Date: 10/22/2023 CLINICAL DATA:  Left hip IM nail. EXAM: DG HIP (WITH OR WITHOUT PELVIS) 2-3V LEFT COMPARISON:  Preoperative radiograph FINDINGS: Six fluoroscopic spot views of the left hip submitted from the operating room. Femoral intramedullary nail with trans trochanteric and  distal locking screw fixation of proximal femur fracture. Fluoroscopy time 4 minutes 40 seconds. Dose 46.89 mGy. IMPRESSION: Intraoperative fluoroscopy during left proximal femur fracture ORIF. Electronically Signed   By: Andrea Gasman M.D.   On: 10/22/2023 16:46   DG C-Arm 1-60 Min-No Report Result Date: 10/22/2023 Fluoroscopy was utilized by the requesting physician.  No radiographic interpretation.   DG Elbow Complete Left Result Date: 10/19/2023 CLINICAL DATA:  Recent fall with elbow pain, initial encounter EXAM: LEFT ELBOW - COMPLETE 3+ VIEW COMPARISON:  None Available. FINDINGS: There is no evidence of fracture, dislocation, or joint effusion. There is no evidence of arthropathy or other focal bone  abnormality. Soft tissues are unremarkable. IMPRESSION: No acute abnormality noted. Electronically Signed   By: Oneil Devonshire M.D.   On: 10/19/2023 21:43   DG Hip Unilat With Pelvis 2-3 Views Left Result Date: 10/19/2023 CLINICAL DATA:  Recent fall with left hip pain, initial encounter EXAM: DG HIP (WITH OR WITHOUT PELVIS) 3V LEFT COMPARISON:  None Available. FINDINGS: Comminuted left intratrochanteric fracture is noted with impaction and angulation at the fracture site. No other fractures are seen. No soft tissue abnormality is noted. IMPRESSION: Comminuted intratrochanteric fracture of the left femur. Electronically Signed   By: Oneil Devonshire M.D.   On: 10/19/2023 21:42    Microbiology: Results for orders placed or performed during the hospital encounter of 10/19/23  Surgical pcr screen     Status: None   Collection Time: 10/22/23  4:40 AM   Specimen: Nasal Mucosa; Nasal Swab  Result Value Ref Range Status   MRSA, PCR NEGATIVE NEGATIVE Final   Staphylococcus aureus NEGATIVE NEGATIVE Final    Comment: (NOTE) The Xpert SA Assay (FDA approved for NASAL specimens in patients 59 years of age and older), is one component of a comprehensive surveillance program. It is not intended to diagnose infection nor to guide or monitor treatment. Performed at [facility], [address] [place], [place] [address]     Labs: CBC: Recent Labs  Lab 10/19/23 2140 10/20/23 0452 10/23/23 0412 10/24/23 0406  WBC 8.7 12.0* 10.9* 8.9  NEUTROABS 6.6  --   --   --   HGB 12.9* 10.9* 8.4* 7.7*  HCT 36.9* 32.2* 24.9* 23.0*  MCV 92.0 91.2 93.6 93.9  PLT 331 303 253 313   Basic Metabolic Panel: Recent Labs  Lab 10/19/23 2140 10/20/23 0452 10/21/23 0333 10/23/23 0412 10/24/23 0406  NA 126* 127* 134* 132* 137  K 3.9 4.1 3.8 3.7 3.5  CL 94* 95* 100 101 104  CO2 19* 19* 22 23 24   GLUCOSE 102* 119* 133* 129* 101*  BUN 8 8 9 10 8   CREATININE 1.03 0.86 0.89 1.00 0.92  CALCIUM 8.0* 7.6* 7.7* 7.7* 7.9*    Liver Function Tests: Recent Labs  Lab 10/19/23 2140  AST 52*  ALT 27  ALKPHOS 75  BILITOT 0.3  PROT 7.5  ALBUMIN 3.7   CBG: Recent Labs  Lab 10/23/23 1128  GLUCAP 117*    Discharge time spent:  35 minutes.  Signed: Eric Nunnery, MD [facility] 10/24/2023

## 2023-10-24 NOTE — Progress Notes (Signed)
 Physical Therapy Treatment Patient Details Name: Carl Evans MRN: [id number] DOB: 1970-10-11 Today's Date: 10/24/2023   History of Present Illness Carl Evans is a 53 y.o. male with medical history significant of alcohol and tobacco abuse who presented after a mechanical fall on 9/13. Carl Evans has presented 9/16 for surgery, with the diagnosis of Intertrochanteric fracture left hip.  FIXATION, FRACTURE, INTERTROCHANTERIC, WITH INTRAMEDULLARY ROD (Left) as a surgical intervention.    PT Comments  Patient pleasant and agreeable to PT session today, received in recliner. Patient continues to demonstrate increased difficulty initiating and completing STS, requiring increased time and excessive forward lean compensation due to decreased strength and pain in LLE. Patient tolerance much improved for short distance ambulation using RW, demonstrates improved weight shift on LLE and more upright posture throughout. Patient tolerated therapeutic exercises without increased in symptoms, notable weakness in LLE. Patient will benefit from continued skilled physical therapy in hospital and recommended venue below to increase strength, balance, endurance for safe ADLs and gait.     If plan is discharge home, recommend the following: A little help with walking and/or transfers;Help with stairs or ramp for entrance;Assist for transportation   Can travel by private vehicle        Equipment Recommendations  None recommended by PT    Recommendations for Other Services       Precautions / Restrictions Precautions Precautions: Fall Recall of Precautions/Restrictions: Intact Restrictions Weight Bearing Restrictions Per Provider Order: No LLE Weight Bearing Per Provider Order: Weight bearing as tolerated     Mobility  Bed Mobility               General bed mobility comments: Patient received in recliner    Transfers Overall transfer level: Needs assistance Equipment used: Rolling walker  (2 wheels) Transfers: Sit to/from Stand Sit to Stand: Contact guard assist   Step pivot transfers: Supervision       General transfer comment: Labored movement, increased time; demonstrates fair return for safely completing functional transfers    Ambulation/Gait Ambulation/Gait assistance: Supervision Gait Distance (Feet): 100 Feet Assistive device: Rolling walker (2 wheels) Gait Pattern/deviations: Decreased step length - right, Decreased step length - left, Decreased stride length, Decreased stance time - left, Decreased weight shift to left, Trunk flexed Gait velocity: Decreased     General Gait Details: Tolerance much improved for short distance ambulation with above noted gait deviation, demonstrates improved WB tolerance and improved upright posture   Stairs             Wheelchair Mobility     Tilt Bed    Modified Rankin (Stroke Patients Only)       Balance Overall balance assessment: Needs assistance Sitting-balance support: No upper extremity supported, Feet supported Sitting balance-Leahy Scale: Good Sitting balance - Comments: seated in recliner   Standing balance support: Bilateral upper extremity supported, During functional activity, Reliant on assistive device for balance Standing balance-Leahy Scale: Fair Standing balance comment: using RW                            Communication Communication Communication: No apparent difficulties  Cognition Arousal: Alert Behavior During Therapy: WFL for tasks assessed/performed   PT - Cognitive impairments: No apparent impairments                         Following commands: Intact      Cueing Cueing Techniques: Verbal  cues, Tactile cues  Exercises General Exercises - Lower Extremity Long Arc Quad: AROM, Strengthening, Both, Seated, 10 reps Hip Flexion/Marching: AROM, Strengthening, 10 reps, Both, Seated Toe Raises: AROM, Strengthening, Both, 10 reps, Seated Heel Raises:  AROM, Strengthening, Both, 10 reps, Seated    General Comments        Pertinent Vitals/Pain Pain Assessment Pain Assessment: Faces Faces Pain Scale: Hurts whole lot Pain Location: L thigh Pain Descriptors / Indicators: Aching, Discomfort, Sore Pain Intervention(s): Monitored during session    Home Living                          Prior Function            PT Goals (current goals can now be found in the care plan section) Acute Rehab PT Goals Patient Stated Goal: return home PT Goal Formulation: With patient Time For Goal Achievement: 11/06/23 Potential to Achieve Goals: Good Progress towards PT goals: Progressing toward goals    Frequency    Min 5X/week      PT Plan      Co-evaluation              AM-PAC PT 6 Clicks Mobility   Outcome Measure  Help needed turning from your back to your side while in a flat bed without using bedrails?: A Little Help needed moving from lying on your back to sitting on the side of a flat bed without using bedrails?: A Little Help needed moving to and from a bed to a chair (including a wheelchair)?: A Little Help needed standing up from a chair using your arms (e.g., wheelchair or bedside chair)?: A Little Help needed to walk in hospital room?: A Little Help needed climbing 3-5 steps with a railing? : A Lot 6 Click Score: 17    End of Session Equipment Utilized During Treatment: Gait belt Activity Tolerance: Patient limited by pain Patient left: in chair;with call bell/phone within reach Nurse Communication: Mobility status PT Visit Diagnosis: Unsteadiness on feet (R26.81);Muscle weakness (generalized) (M62.81);Other abnormalities of gait and mobility (R26.89)     Time: 9062-8995 PT Time Calculation (min) (ACUTE ONLY): 27 min  Charges:    $Gait Training: 8-22 mins $Therapeutic Exercise: 8-22 mins PT General Charges $$ ACUTE PT VISIT: 1 Visit                     1:54 PM, 10/24/23,  Carl Evans,  SPT

## 2023-10-24 NOTE — Plan of Care (Signed)

## 2023-10-24 NOTE — Plan of Care (Signed)
  Problem: Education: Goal: Knowledge of General Education information will improve Description: Including pain rating scale, medication(s)/side effects and non-pharmacologic comfort measures Outcome: Progressing   Problem: Health Behavior/Discharge Planning: Goal: Ability to manage health-related needs will improve Outcome: Progressing   Problem: Clinical Measurements: Goal: Ability to maintain clinical measurements within normal limits will improve Outcome: Progressing Goal: Will remain free from infection Outcome: Progressing   Problem: Activity: Goal: Risk for activity intolerance will decrease Outcome: Progressing   Problem: Nutrition: Goal: Adequate nutrition will be maintained Outcome: Progressing   Problem: Coping: Goal: Level of anxiety will decrease Outcome: Progressing   Problem: Education: Goal: Verbalization of understanding the information provided (i.e., activity precautions, restrictions, etc) will improve Outcome: Progressing   Problem: Activity: Goal: Ability to ambulate and perform ADLs will improve Outcome: Progressing   Problem: Clinical Measurements: Goal: Postoperative complications will be avoided or minimized Outcome: Progressing   Problem: Pain Management: Goal: Pain level will decrease Outcome: Progressing

## 2023-10-24 NOTE — Progress Notes (Signed)
 Mobility Specialist Progress Note:    10/24/23 0905  Mobility  Activity Ambulated with assistance  Level of Assistance Modified independent, requires aide device or extra time  Assistive Device Front wheel walker  Distance Ambulated (ft) 16 ft  Range of Motion/Exercises Active;All extremities  LLE Weight Bearing Per Provider Order WBAT  Activity Response Tolerated well  Mobility Referral Yes  Mobility visit 1 Mobility  Mobility Specialist Start Time (ACUTE ONLY) [id number]  Mobility Specialist Stop Time (ACUTE ONLY) 0919  Mobility Specialist Time Calculation (min) (ACUTE ONLY) 14 min   Pt received in chair, requesting assistance to bathroom. ModI to stand and ambulate with RW. Tolerated well, asx throughout. Returned pt to chair, call bell in reach. NT in room, all needs met.  Daquawn Seelman Mobility Specialist Please contact via Special educational needs teacher or  Rehab office at [phone]

## 2023-10-25 LAB — TYPE AND SCREEN
ABO/RH(D): O POS
Antibody Screen: NEGATIVE
Unit division: 0

## 2023-10-25 LAB — BPAM RBC
Blood Product Expiration Date: 202510142359
ISSUE DATE / TIME: 202509181049
Unit Type and Rh: 5100

## 2023-11-20 ENCOUNTER — Ambulatory Visit: Admitting: Orthopedic Surgery

## 2023-11-20 ENCOUNTER — Encounter: Payer: Self-pay | Admitting: Orthopedic Surgery

## 2023-11-20 ENCOUNTER — Other Ambulatory Visit (INDEPENDENT_AMBULATORY_CARE_PROVIDER_SITE_OTHER): Payer: Self-pay

## 2023-11-20 DIAGNOSIS — S72102D Unspecified trochanteric fracture of left femur, subsequent encounter for closed fracture with routine healing: Secondary | ICD-10-CM

## 2023-11-20 MED ORDER — OXYCODONE HCL 5 MG PO TABS: 5.0000 mg | ORAL_TABLET | ORAL | 0 refills | Status: AC | PRN

## 2023-11-20 MED ORDER — IBUPROFEN 800 MG PO TABS: 800.0000 mg | ORAL_TABLET | Freq: Three times a day (TID) | ORAL | 1 refills | Status: AC | PRN

## 2023-11-20 NOTE — Progress Notes (Signed)
    11/20/2023   Chief Complaint  Patient presents with   Post-op Follow-up    Left hip 10/22/23    Encounter Diagnosis  Name Primary?   Closed pertrochanteric fracture of left femur with routine healing, subsequent encounter 10/22/23 Yes    What pharmacy do you use ? _[facility] __________________________  DOI/DOS/ Date:    Unchanged states in a lot of pain wants oxy 5

## 2023-11-20 NOTE — Progress Notes (Signed)
    11/20/2023   Chief Complaint  Patient presents with   Post-op Follow-up    Left hip 10/22/23    Encounter Diagnosis  Name Primary?   Closed pertrochanteric fracture of left femur with routine healing, subsequent encounter 10/22/23 Yes    What pharmacy do you use ? _[facility] __________________________  DOI/DOS/ Date:    Unchanged states in a lot of pain wants oxy 5  22 male status post peritrochanteric fracture left hip status post cephalomedullary nailing  Incisions look clean dry and intact there are no signs of infection or drainage  X-ray shows the implant is in good position and the fracture alignment is maintained  Leg lengths were equal.  No peripheral edema or swelling  Recommend x-ray again in 4 weeks  Add ibuprofen to help with pain relief  Meds ordered this encounter  Medications   ibuprofen (ADVIL) 800 MG tablet    Sig: Take 1 tablet (800 mg total) by mouth every 8 (eight) hours as needed.    Dispense:  90 tablet    Refill:  1   oxyCODONE  (OXY IR/ROXICODONE ) 5 MG immediate release tablet    Sig: Take 1 tablet (5 mg total) by mouth every 4 (four) hours as needed for up to 5 days for severe pain (pain score 7-10).    Dispense:  30 tablet    Refill:  0

## 2023-12-18 ENCOUNTER — Encounter: Admitting: Orthopedic Surgery

## 2024-04-01 ENCOUNTER — Ambulatory Visit

## 2024-06-02 ENCOUNTER — Ambulatory Visit: Payer: Self-pay
